# Patient Record
Sex: Male | Born: 1985
Health system: Southern US, Community
[De-identification: ages and names within clinical notes are randomized; demographics above are authoritative.]

## PROBLEM LIST (undated history)

## (undated) DIAGNOSIS — F419 Anxiety disorder, unspecified: Secondary | ICD-10-CM

## (undated) DIAGNOSIS — F988 Other specified behavioral and emotional disorders with onset usually occurring in childhood and adolescence: Secondary | ICD-10-CM

## (undated) DIAGNOSIS — C419 Malignant neoplasm of bone and articular cartilage, unspecified: Secondary | ICD-10-CM

## (undated) DIAGNOSIS — R0683 Snoring: Secondary | ICD-10-CM

## (undated) HISTORY — DX: Snoring: R06.83

## (undated) HISTORY — DX: Anxiety disorder, unspecified: F41.9

## (undated) HISTORY — PX: OTHER SURGICAL HISTORY: SHX169

## (undated) HISTORY — DX: Other specified behavioral and emotional disorders with onset usually occurring in childhood and adolescence: F98.8

## (undated) HISTORY — PX: WISDOM TOOTH EXTRACTION: SHX21

## (undated) HISTORY — DX: Malignant neoplasm of bone and articular cartilage, unspecified: C41.9

---
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
## 2006-06-18 ENCOUNTER — Encounter: Admission: RE | Admit: 2006-06-18 | Discharge: 2006-06-18 | Payer: Self-pay | Admitting: Orthopedic Surgery

## 2006-09-11 ENCOUNTER — Encounter: Admission: RE | Admit: 2006-09-11 | Discharge: 2006-09-11 | Payer: Self-pay | Admitting: Orthopedic Surgery

## 2012-03-01 DIAGNOSIS — Z85830 Personal history of malignant neoplasm of bone: Secondary | ICD-10-CM | POA: Insufficient documentation

## 2012-03-01 DIAGNOSIS — Z86018 Personal history of other benign neoplasm: Secondary | ICD-10-CM | POA: Insufficient documentation

## 2013-07-17 ENCOUNTER — Other Ambulatory Visit: Payer: Self-pay | Admitting: *Deleted

## 2013-07-17 ENCOUNTER — Ambulatory Visit
Admission: RE | Admit: 2013-07-17 | Discharge: 2013-07-17 | Disposition: A | Discharge status: Home / Self Care | Payer: No Typology Code available for payment source | Source: Ambulatory Visit | Attending: *Deleted | Admitting: *Deleted

## 2013-07-17 DIAGNOSIS — R7611 Nonspecific reaction to tuberculin skin test without active tuberculosis: Secondary | ICD-10-CM

## 2015-12-02 ENCOUNTER — Ambulatory Visit: Payer: Self-pay | Admitting: Interventional Cardiology

## 2015-12-03 ENCOUNTER — Other Ambulatory Visit (HOSPITAL_COMMUNITY): Payer: Self-pay

## 2015-12-15 ENCOUNTER — Other Ambulatory Visit (HOSPITAL_COMMUNITY): Payer: Self-pay | Admitting: Internal Medicine

## 2015-12-15 DIAGNOSIS — C419 Malignant neoplasm of bone and articular cartilage, unspecified: Secondary | ICD-10-CM

## 2016-01-05 ENCOUNTER — Ambulatory Visit (HOSPITAL_COMMUNITY): Payer: BLUE CROSS/BLUE SHIELD | Attending: Cardiology

## 2016-01-05 ENCOUNTER — Other Ambulatory Visit: Payer: Self-pay

## 2016-01-05 ENCOUNTER — Encounter (INDEPENDENT_AMBULATORY_CARE_PROVIDER_SITE_OTHER): Payer: Self-pay

## 2016-01-05 DIAGNOSIS — Z9221 Personal history of antineoplastic chemotherapy: Secondary | ICD-10-CM | POA: Diagnosis not present

## 2016-01-05 DIAGNOSIS — C419 Malignant neoplasm of bone and articular cartilage, unspecified: Secondary | ICD-10-CM | POA: Insufficient documentation

## 2016-01-05 DIAGNOSIS — I358 Other nonrheumatic aortic valve disorders: Secondary | ICD-10-CM | POA: Insufficient documentation

## 2016-01-25 DIAGNOSIS — M2170 Unequal limb length (acquired), unspecified site: Secondary | ICD-10-CM | POA: Diagnosis not present

## 2016-01-25 DIAGNOSIS — C4022 Malignant neoplasm of long bones of left lower limb: Secondary | ICD-10-CM | POA: Diagnosis not present

## 2016-01-25 DIAGNOSIS — Z85830 Personal history of malignant neoplasm of bone: Secondary | ICD-10-CM | POA: Diagnosis not present

## 2016-03-15 DIAGNOSIS — L237 Allergic contact dermatitis due to plants, except food: Secondary | ICD-10-CM | POA: Diagnosis not present

## 2016-03-15 DIAGNOSIS — Z6824 Body mass index (BMI) 24.0-24.9, adult: Secondary | ICD-10-CM | POA: Diagnosis not present

## 2016-04-06 DIAGNOSIS — M5489 Other dorsalgia: Secondary | ICD-10-CM | POA: Diagnosis not present

## 2016-04-06 DIAGNOSIS — Z6824 Body mass index (BMI) 24.0-24.9, adult: Secondary | ICD-10-CM | POA: Diagnosis not present

## 2016-04-06 DIAGNOSIS — R262 Difficulty in walking, not elsewhere classified: Secondary | ICD-10-CM | POA: Diagnosis not present

## 2016-04-20 DIAGNOSIS — R8299 Other abnormal findings in urine: Secondary | ICD-10-CM | POA: Diagnosis not present

## 2016-04-20 DIAGNOSIS — Z0000 Encounter for general adult medical examination without abnormal findings: Secondary | ICD-10-CM | POA: Diagnosis not present

## 2016-04-24 DIAGNOSIS — H9193 Unspecified hearing loss, bilateral: Secondary | ICD-10-CM | POA: Diagnosis not present

## 2016-04-24 DIAGNOSIS — R8299 Other abnormal findings in urine: Secondary | ICD-10-CM | POA: Diagnosis not present

## 2016-04-24 DIAGNOSIS — C419 Malignant neoplasm of bone and articular cartilage, unspecified: Secondary | ICD-10-CM | POA: Diagnosis not present

## 2016-04-24 DIAGNOSIS — Z1389 Encounter for screening for other disorder: Secondary | ICD-10-CM | POA: Diagnosis not present

## 2016-04-24 DIAGNOSIS — Z0000 Encounter for general adult medical examination without abnormal findings: Secondary | ICD-10-CM | POA: Diagnosis not present

## 2016-04-24 DIAGNOSIS — M5489 Other dorsalgia: Secondary | ICD-10-CM | POA: Diagnosis not present

## 2016-04-24 DIAGNOSIS — Z23 Encounter for immunization: Secondary | ICD-10-CM | POA: Diagnosis not present

## 2016-04-24 DIAGNOSIS — F418 Other specified anxiety disorders: Secondary | ICD-10-CM | POA: Diagnosis not present

## 2016-05-22 DIAGNOSIS — D229 Melanocytic nevi, unspecified: Secondary | ICD-10-CM | POA: Diagnosis not present

## 2016-05-22 DIAGNOSIS — I781 Nevus, non-neoplastic: Secondary | ICD-10-CM | POA: Diagnosis not present

## 2016-05-22 DIAGNOSIS — D239 Other benign neoplasm of skin, unspecified: Secondary | ICD-10-CM | POA: Diagnosis not present

## 2016-06-15 DIAGNOSIS — R351 Nocturia: Secondary | ICD-10-CM | POA: Diagnosis not present

## 2016-06-15 DIAGNOSIS — R3121 Asymptomatic microscopic hematuria: Secondary | ICD-10-CM | POA: Diagnosis not present

## 2016-06-21 DIAGNOSIS — R3121 Asymptomatic microscopic hematuria: Secondary | ICD-10-CM | POA: Diagnosis not present

## 2016-06-21 DIAGNOSIS — N200 Calculus of kidney: Secondary | ICD-10-CM | POA: Diagnosis not present

## 2016-11-30 DIAGNOSIS — Z6825 Body mass index (BMI) 25.0-25.9, adult: Secondary | ICD-10-CM | POA: Diagnosis not present

## 2016-11-30 DIAGNOSIS — R202 Paresthesia of skin: Secondary | ICD-10-CM | POA: Diagnosis not present

## 2016-11-30 DIAGNOSIS — L658 Other specified nonscarring hair loss: Secondary | ICD-10-CM | POA: Diagnosis not present

## 2017-01-09 DIAGNOSIS — L659 Nonscarring hair loss, unspecified: Secondary | ICD-10-CM | POA: Diagnosis not present

## 2017-02-23 DIAGNOSIS — S63502A Unspecified sprain of left wrist, initial encounter: Secondary | ICD-10-CM | POA: Diagnosis not present

## 2017-03-01 DIAGNOSIS — S63502D Unspecified sprain of left wrist, subsequent encounter: Secondary | ICD-10-CM | POA: Diagnosis not present

## 2017-03-26 DIAGNOSIS — S63502D Unspecified sprain of left wrist, subsequent encounter: Secondary | ICD-10-CM | POA: Diagnosis not present

## 2017-05-12 ENCOUNTER — Emergency Department (HOSPITAL_COMMUNITY): Payer: BLUE CROSS/BLUE SHIELD

## 2017-05-12 ENCOUNTER — Other Ambulatory Visit: Payer: Self-pay

## 2017-05-12 ENCOUNTER — Emergency Department (HOSPITAL_COMMUNITY)
Admission: EM | Admit: 2017-05-12 | Discharge: 2017-05-12 | Disposition: A | Discharge status: Home / Self Care | Payer: BLUE CROSS/BLUE SHIELD | Attending: Emergency Medicine | Admitting: Emergency Medicine

## 2017-05-12 DIAGNOSIS — N201 Calculus of ureter: Secondary | ICD-10-CM

## 2017-05-12 DIAGNOSIS — R112 Nausea with vomiting, unspecified: Secondary | ICD-10-CM | POA: Diagnosis not present

## 2017-05-12 DIAGNOSIS — R1011 Right upper quadrant pain: Secondary | ICD-10-CM | POA: Diagnosis not present

## 2017-05-12 DIAGNOSIS — R109 Unspecified abdominal pain: Secondary | ICD-10-CM | POA: Diagnosis not present

## 2017-05-12 DIAGNOSIS — N1330 Unspecified hydronephrosis: Secondary | ICD-10-CM | POA: Diagnosis not present

## 2017-05-12 DIAGNOSIS — R1031 Right lower quadrant pain: Secondary | ICD-10-CM | POA: Diagnosis not present

## 2017-05-12 LAB — CBC WITH DIFFERENTIAL/PLATELET
Basophils Absolute: 0 10*3/uL (ref 0.0–0.1)
Basophils Relative: 0 %
Eosinophils Absolute: 0 10*3/uL (ref 0.0–0.7)
Eosinophils Relative: 0 %
HEMATOCRIT: 41.2 % (ref 39.0–52.0)
Hemoglobin: 14.7 g/dL (ref 13.0–17.0)
LYMPHS PCT: 17 %
Lymphs Abs: 1.8 10*3/uL (ref 0.7–4.0)
MCH: 31.8 pg (ref 26.0–34.0)
MCHC: 35.7 g/dL (ref 30.0–36.0)
MCV: 89.2 fL (ref 78.0–100.0)
MONOS PCT: 8 %
Monocytes Absolute: 0.8 10*3/uL (ref 0.1–1.0)
NEUTROS ABS: 7.7 10*3/uL (ref 1.7–7.7)
Neutrophils Relative %: 75 %
Platelets: 325 10*3/uL (ref 150–400)
RBC: 4.62 MIL/uL (ref 4.22–5.81)
RDW: 12.8 % (ref 11.5–15.5)
WBC: 10.3 10*3/uL (ref 4.0–10.5)

## 2017-05-12 LAB — URINALYSIS, ROUTINE W REFLEX MICROSCOPIC
BACTERIA UA: NONE SEEN
Bilirubin Urine: NEGATIVE
Glucose, UA: NEGATIVE mg/dL
Ketones, ur: NEGATIVE mg/dL
Leukocytes, UA: NEGATIVE
Nitrite: NEGATIVE
PH: 8 (ref 5.0–8.0)
Protein, ur: NEGATIVE mg/dL
SPECIFIC GRAVITY, URINE: 1.003 — AB (ref 1.005–1.030)
Squamous Epithelial / LPF: NONE SEEN

## 2017-05-12 LAB — COMPREHENSIVE METABOLIC PANEL
ALBUMIN: 4.5 g/dL (ref 3.5–5.0)
ALT: 25 U/L (ref 17–63)
AST: 28 U/L (ref 15–41)
Alkaline Phosphatase: 61 U/L (ref 38–126)
Anion gap: 10 (ref 5–15)
BUN: 14 mg/dL (ref 6–20)
CHLORIDE: 103 mmol/L (ref 101–111)
CO2: 24 mmol/L (ref 22–32)
Calcium: 9.5 mg/dL (ref 8.9–10.3)
Creatinine, Ser: 0.98 mg/dL (ref 0.61–1.24)
GFR calc Af Amer: >60 mL/min (ref >60)
GFR calc non Af Amer: >60 mL/min (ref >60)
GLUCOSE: 102 mg/dL — AB (ref 65–99)
POTASSIUM: 3.5 mmol/L (ref 3.5–5.1)
Sodium: 137 mmol/L (ref 135–145)
Total Bilirubin: 1.7 mg/dL — ABNORMAL HIGH (ref 0.3–1.2)
Total Protein: 7.7 g/dL (ref 6.5–8.1)

## 2017-05-12 LAB — LIPASE, BLOOD: Lipase: 27 U/L (ref 11–51)

## 2017-05-12 MED ORDER — KETOROLAC TROMETHAMINE 30 MG/ML IJ SOLN
30.0000 mg | Freq: Once | INTRAMUSCULAR | Status: AC
  Administered 2017-05-12: 30 mg via INTRAVENOUS
  Filled 2017-05-12: qty 1

## 2017-05-12 MED ORDER — OXYCODONE-ACETAMINOPHEN 5-325 MG PO TABS
1.0000 | ORAL_TABLET | Freq: Once | ORAL | Status: AC
  Administered 2017-05-12: 1 via ORAL
  Filled 2017-05-12: qty 1

## 2017-05-12 MED ORDER — ONDANSETRON 4 MG PO TBDP: 4.0000 mg | ORAL_TABLET | Freq: Three times a day (TID) | ORAL | 0 refills | Status: DC | PRN

## 2017-05-12 MED ORDER — OXYCODONE-ACETAMINOPHEN 5-325 MG PO TABS: 2.0000 | ORAL_TABLET | Freq: Four times a day (QID) | ORAL | 0 refills | Status: DC | PRN

## 2017-05-12 MED ORDER — MORPHINE SULFATE (PF) 4 MG/ML IV SOLN
4.0000 mg | Freq: Once | INTRAVENOUS | Status: AC
Start: 2017-05-12 — End: 2017-05-12
  Administered 2017-05-12 (×2): 4 mg via INTRAVENOUS
  Filled 2017-05-12: qty 1

## 2017-05-12 MED ORDER — ONDANSETRON HCL 4 MG/2ML IJ SOLN
4.0000 mg | Freq: Once | INTRAMUSCULAR | Status: AC
  Administered 2017-05-12: 4 mg via INTRAVENOUS
  Filled 2017-05-12: qty 2

## 2017-05-12 MED ORDER — TAMSULOSIN HCL 0.4 MG PO CAPS: 0.4000 mg | ORAL_CAPSULE | Freq: Every day | ORAL | 0 refills | Status: DC

## 2017-05-12 NOTE — ED Triage Notes (Signed)
Pt reports right flank pain and nausea.

## 2017-05-12 NOTE — ED Provider Notes (Signed)
[facility] DEPT Provider Note   CSN: [id number] Arrival date & time: 05/12/17  1421     History   Chief Complaint Chief Complaint  Patient presents with  . Flank Pain    HPI Adam Trevino is a 31 y.o. male.  31 year old healthy male who presents with right flank pain and vomiting.  Several days ago, the patient began having intermittent, mild to moderate right flank pain and increased urinary frequency.  He did not notice any dysuria or hematuria.  2 days ago, he felt unwell and had an episode of vomiting but yesterday he felt better and did not think much of it.  Last night he started feeling unwell again and vomited again.  His right flank pain started again today and he has had associated nausea.  He denies any fevers or diarrhea.  He has had some mild upper right abdominal pain in the same corresponding area as his right flank.  He denies any lower pain or radiation into his groin.  No testicular pain.  No difficulty urinating.  No history of kidney stones but he does state that several months ago he was worked up for microscopic hematuria with no abnormal findings.  No medications prior to arrival.  No cough/cold symptoms or recent illness.   The history is provided by the patient.  Flank Pain     No past medical history on file.  There are no active problems to display for this patient.   No past surgical history on file.     Home Medications    Prior to Admission medications   Medication Sig Start Date End Date Taking? Authorizing Provider  ondansetron (ZOFRAN ODT) 4 MG disintegrating tablet Take 1 tablet (4 mg total) every 8 (eight) hours as needed by mouth for nausea or vomiting. 05/12/17   Little, Wenda Overland, MD  oxyCODONE-acetaminophen (PERCOCET) 5-325 MG tablet Take 2 tablets every 6 (six) hours as needed by mouth. 05/12/17   Little, Wenda Overland, MD  tamsulosin [facility]) 0.4 MG CAPS capsule Take 1 capsule (0.4 mg total)  daily by mouth. 05/12/17   Little, Wenda Overland, MD    Family History No family history on file.  Social History Social History   Tobacco Use  . Smoking status: Not on file  Substance Use Topics  . Alcohol use: Not on file  . Drug use: Not on file     Allergies   Patient has no known allergies.   Review of Systems Review of Systems  Genitourinary: Positive for flank pain.   All other systems reviewed and are negative except that which was mentioned in HPI   Physical Exam Updated Vital Signs BP 133/88 (BP Location: Right Arm)   Pulse 82   Temp 97.9 F (36.6 C) (Oral)   Resp 18   SpO2 96%   Physical Exam  Constitutional: He is oriented to person, place, and time. He appears well-developed and well-nourished. No distress.  HENT:  Head: Normocephalic and atraumatic.  Moist mucous membranes  Eyes: Conjunctivae are normal.  Neck: Neck supple.  Cardiovascular: Normal rate, regular rhythm and normal heart sounds.  No murmur heard. Pulmonary/Chest: Effort normal and breath sounds normal.  Abdominal: Soft. Bowel sounds are normal. He exhibits no distension. There is no tenderness.  Musculoskeletal: He exhibits no edema.  No CVA tenderness  Neurological: He is alert and oriented to person, place, and time.  Fluent speech  Skin: Skin is warm and dry.  Psychiatric: He has  a normal mood and affect. Judgment normal.  Nursing note and vitals reviewed.    ED Treatments / Results  Labs (all labs ordered are listed, but only abnormal results are displayed) Labs Reviewed  URINALYSIS, ROUTINE W REFLEX MICROSCOPIC - Abnormal; Notable for the following components:      Result Value   Color, Urine STRAW (*)    Specific Gravity, Urine 1.003 (*)    Hgb urine dipstick MODERATE (*)    All other components within normal limits  COMPREHENSIVE METABOLIC PANEL - Abnormal; Notable for the following components:   Glucose, Bld 102 (*)    Total Bilirubin 1.7 (*)    All other  components within normal limits  LIPASE, BLOOD  CBC WITH DIFFERENTIAL/PLATELET    EKG  EKG Interpretation None       Radiology Ct Renal Stone Study  Result Date: 05/12/2017 CLINICAL DATA:  Right flank pain. EXAM: CT ABDOMEN AND PELVIS WITHOUT CONTRAST TECHNIQUE: Multidetector CT imaging of the abdomen and pelvis was performed following the standard protocol without IV contrast. COMPARISON:  None. FINDINGS: Lower chest: No acute abnormality. Hepatobiliary: No focal liver abnormality is seen. No gallstones, gallbladder wall thickening, or biliary dilatation. Pancreas: Unremarkable. No pancreatic ductal dilatation or surrounding inflammatory changes. Spleen: Normal in size without focal abnormality. Adrenals/Urinary Tract: The adrenal glands and left kidney are unremarkable. There is a 4 mm calculus in the distal right ureter, just proximal to the UVJ. There is resultant mild right hydronephrosis. The bladder is decompressed. Stomach/Bowel: Stomach is within normal limits. Appendix appears normal. No evidence of bowel wall thickening, distention, or inflammatory changes. Vascular/Lymphatic: No significant vascular findings are present. No enlarged abdominal or pelvic lymph nodes. Reproductive: Prostate is unremarkable. Other: No free fluid or pneumoperitoneum. Musculoskeletal: No acute or significant osseous findings. IMPRESSION: 1. 4 mm calculus in the distal right ureter, just proximal to the UVJ, with resultant mild right hydronephrosis. Electronically Signed   By: Titus Dubin M.D.   On: 05/12/2017 16:54    Procedures Procedures (including critical care time)  Medications Ordered in ED Medications  ondansetron (ZOFRAN) injection 4 mg (4 mg Intravenous Given 05/12/17 1642)  morphine 4 MG/ML injection 4 mg (4 mg Intravenous Given 05/12/17 1649)  oxyCODONE-acetaminophen (PERCOCET/ROXICET) 5-325 MG per tablet 1 tablet (1 tablet Oral Given 05/12/17 1749)  ketorolac (TORADOL) 30 MG/ML  injection 30 mg (30 mg Intravenous Given 05/12/17 1749)     Initial Impression / Assessment and Plan / ED Course  I have reviewed the triage vital signs and the nursing notes.  Pertinent labs & imaging results that were available during my care of the patient were reviewed by me and considered in my medical decision making (see chart for details).    Several days intermittent R flank pain, urinary frequency, and N/V.  Nontoxic on exam with normal vital signs, afebrile.  No focal lower abdominal tenderness.  I suspect kidney stone, because the patient does not have a history of this and it has been going on for several days, obtain CT of abdomen to rule out obstructing stone.  Cr. And WBC normal, UA w/ trace blood but no infection. CT shows 61mm calculus distal R ureter near UVJ w/ mild hydro.  Given patient is well-appearing, comfortable after above medications, and without any signs of infection I feel he is appropriate for outpatient management.  I have discussed supportive measures, the importance of good pain control and control of nausea, and follow-up with urologist.  Discussed using strainer.  Extensively reviewed return precautions.  He voiced understanding was discharged in satisfactory condition. Final Clinical Impressions(s) / ED Diagnoses   Final diagnoses:  Right ureteral stone    ED Discharge Orders        Ordered    tamsulosin (FLOMAX) 0.4 MG CAPS capsule  Daily     05/12/17 1750    oxyCODONE-acetaminophen (PERCOCET) 5-325 MG tablet  Every 6 hours PRN     05/12/17 1750    ondansetron (ZOFRAN ODT) 4 MG disintegrating tablet  Every 8 hours PRN     05/12/17 1750       Little, Wenda Overland, MD 05/12/17 1759

## 2017-05-16 DIAGNOSIS — R8271 Bacteriuria: Secondary | ICD-10-CM | POA: Diagnosis not present

## 2017-05-16 DIAGNOSIS — N201 Calculus of ureter: Secondary | ICD-10-CM | POA: Diagnosis not present

## 2017-05-23 DIAGNOSIS — N201 Calculus of ureter: Secondary | ICD-10-CM | POA: Diagnosis not present

## 2017-06-13 DIAGNOSIS — N201 Calculus of ureter: Secondary | ICD-10-CM | POA: Diagnosis not present

## 2017-07-24 DIAGNOSIS — F321 Major depressive disorder, single episode, moderate: Secondary | ICD-10-CM | POA: Diagnosis not present

## 2017-11-06 DIAGNOSIS — L821 Other seborrheic keratosis: Secondary | ICD-10-CM | POA: Diagnosis not present

## 2017-11-06 DIAGNOSIS — D1801 Hemangioma of skin and subcutaneous tissue: Secondary | ICD-10-CM | POA: Diagnosis not present

## 2017-11-06 DIAGNOSIS — L814 Other melanin hyperpigmentation: Secondary | ICD-10-CM | POA: Diagnosis not present

## 2017-11-06 DIAGNOSIS — D2270 Melanocytic nevi of unspecified lower limb, including hip: Secondary | ICD-10-CM | POA: Diagnosis not present

## 2017-12-04 DIAGNOSIS — M7072 Other bursitis of hip, left hip: Secondary | ICD-10-CM | POA: Diagnosis not present

## 2018-01-29 DIAGNOSIS — R82998 Other abnormal findings in urine: Secondary | ICD-10-CM | POA: Diagnosis not present

## 2018-01-29 DIAGNOSIS — Z0000 Encounter for general adult medical examination without abnormal findings: Secondary | ICD-10-CM | POA: Diagnosis not present

## 2018-02-05 DIAGNOSIS — R350 Frequency of micturition: Secondary | ICD-10-CM | POA: Diagnosis not present

## 2018-02-05 DIAGNOSIS — R202 Paresthesia of skin: Secondary | ICD-10-CM | POA: Diagnosis not present

## 2018-02-05 DIAGNOSIS — Z0000 Encounter for general adult medical examination without abnormal findings: Secondary | ICD-10-CM | POA: Diagnosis not present

## 2018-02-05 DIAGNOSIS — H9193 Unspecified hearing loss, bilateral: Secondary | ICD-10-CM | POA: Diagnosis not present

## 2018-02-05 DIAGNOSIS — L658 Other specified nonscarring hair loss: Secondary | ICD-10-CM | POA: Diagnosis not present

## 2018-02-05 DIAGNOSIS — Z1331 Encounter for screening for depression: Secondary | ICD-10-CM | POA: Diagnosis not present

## 2018-02-05 DIAGNOSIS — Z23 Encounter for immunization: Secondary | ICD-10-CM | POA: Diagnosis not present

## 2018-04-02 ENCOUNTER — Encounter: Payer: Self-pay | Admitting: Neurology

## 2018-04-04 ENCOUNTER — Ambulatory Visit (INDEPENDENT_AMBULATORY_CARE_PROVIDER_SITE_OTHER): Payer: BLUE CROSS/BLUE SHIELD | Admitting: Neurology

## 2018-04-04 ENCOUNTER — Encounter: Payer: Self-pay | Admitting: Neurology

## 2018-04-04 VITALS — BP 140/87 | HR 72 | Ht 67.0 in | Wt 163.0 lb

## 2018-04-04 DIAGNOSIS — F5105 Insomnia due to other mental disorder: Secondary | ICD-10-CM | POA: Diagnosis not present

## 2018-04-04 DIAGNOSIS — F99 Mental disorder, not otherwise specified: Secondary | ICD-10-CM

## 2018-04-04 DIAGNOSIS — G4719 Other hypersomnia: Secondary | ICD-10-CM | POA: Diagnosis not present

## 2018-04-04 DIAGNOSIS — R0683 Snoring: Secondary | ICD-10-CM

## 2018-04-04 NOTE — Progress Notes (Signed)
SLEEP MEDICINE CLINIC   Provider:  Larey Seat, M.D.   Primary Care Physician:  [name], MD   Referring Provider: [name], MD    Chief Complaint  Patient presents with  . New Patient (Initial Visit)    pt alone, rm 11. pt states has had diffivulty with sleeping. he has a hard time stopping the brain from being on the go. smokes marijuana at bedtime and this allows him to fall asleep. -states that without it he can't sleep. Even after he gets a full 8 hours of sleep he still feels tired in AM . he has been told he snores. he wakes up  gasping for air and from his snoring.     HPI:  Adam Trevino is a 32 y.o. male patient  seen here on 04-04-2018  in a referral  from Dr. Joylene Draft for a sleep evaluation.   Chief complaint according to patient : Adam Trevino is a 32 year old Caucasian right-handed gentleman who reports that he has trouble going to sleep and finding restful, restorative sleep.  It seems to be not important how many hours he sleeps as he feels unrefreshed nonetheless.  He also reports that there are some days but he has a lot of energy alternating with those times where he has none.  He would like to see if this is related to a primary sleep disorder.  He has been told that he snores, by a sequence of girlfriends over years.   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 He travels within the [place] region. No jet lag, no tie zone changes. He has no set schedule. He skips meals, no meal times.  When he travels he will check into his hotel room and then goes for the next fastest available food.  At home he is usually home before 8 PM, he may not have had lunch he may have skipped breakfast on some days - dinnertime varies, usually fast food. There are 2 days each week where he does not travel and is actually at home. He smokes marijuana each night at 9-10 PM.  He aims to relax  on the couch, sleeps there in front of the TV, usually between 10 and 11 PM and between midnight and 1 he will go to his bedroom from there and continues to sleep.  Bedroom is cool, quiet and dark.  There is no TV in the bedroom.  He usually sleeps through until 6 AM. One bathroom break.  If not smoking pot :"my mind races so much " .  Sleep medical history and family sleep history:  Maternal uncle is insomnic. Father died when patient was 36, he had cancer at age 50-13 , osteosarcoma.  Past diagnosis anxiety, snoring, has seen audiology the patient was diagnosed with cancer at age 4, lost hearing after chemotherapy. He was in psychiatric treatment for anxiety- Xanax ( [facility]) had to go to detox.  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 In summer he golfs- keeps active. The patient worked for a year for an [employer] firm [employer] here in [place].   Review of Systems: Out of a complete 14 system review, the patient complains of only the following symptoms, and all other reviewed systems are negative.  anxiety.   Epworth Sleepiness Score : 7/ 24 points ,  Fatigue Severity Score:  44/ 63 points ,  depression score high.    Family History  Problem Relation Age of Onset  . Colon cancer Mother   . Lung cancer Father   . Prostate cancer Maternal Grandfather   . Heart attack Paternal Grandfather     Past Medical History:  Diagnosis Date  . ADD (attention deficit disorder)   . Anxiety   . 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  . Snoring     Allergies as of 04/04/2018  . (No Known Allergies)    Vitals:  BP 140/87   Pulse 72   Ht 5\' 7"  (1.702 m)   Wt 163 lb (73.9 kg)   BMI 25.53 kg/m  Last Weight:  Wt Readings from Last 1 Encounters:  04/04/18 163 lb (73.9 kg)   AST:MHDQ mass index is 25.53 kg/m.     Last Height:   Ht Readings from Last 1 Encounters:  04/04/18 5\' 7"   (1.702 m)    Physical exam:  General: The patient is awake, alert and appears not in acute distress. The patient is well groomed. Head: Normocephalic, atraumatic. Neck is supple. Mallampati 2  neck circumference:16. 25 " . Nasal airflow patent ,facial hair. Retrognathia is not seen., but crowded lower jaw.  Cardiovascular:  Regular rate and rhythm, without  murmurs or carotid bruit, and without distended neck veins. Respiratory: Lungs are clear to auscultation. Skin:  Without evidence of edema, or rash Trunk: BMI is 25.5  The patient's posture is erect    Neurologic exam : The patient is awake and alert, oriented to place and time.   Memory subjective  described as intact.    Attention span & concentration ability appears limited Speech is fluent,  without  dysarthria, dysphonia or aphasia.  Mood and affect are defensive, impulsive, pressured speech  Cranial nerves: Pupils are equal and briskly reactive to light. Funduscopic exam without evidence of pallor or edema. Extraocular movements  in vertical and horizontal planes intact and without nystagmus. Visual fields by finger perimetry are intact. Hearing to finger rub intact.   Facial sensation intact to fine touch.  Facial motor strength is symmetric and tongue and uvula move midline. Shoulder shrug was symmetrical.   Motor exam:   Normal tone, muscle bulk and symmetric strength in all extremities. Sensory:  Fine touch, pinprick and vibration were tested in all extremities. Proprioception tested in the upper extremities was normal. Coordination:Finger-to-nose maneuver  normal without evidence of ataxia, dysmetria or tremor. Gait and station: Patient walks without assistive device and is able unassisted to climb up to the exam table. Strength within normal limits.  Stance is stable and normal.  Toe stand unfragmented. Turns with  3  Steps.  Deep tendon reflexes: in the  upper and lower extremities are symmetric and intact.     Assessment:  After physical and neurologic examination, review of laboratory studies,  Personal review of imaging studies, reports of other /same  Imaging studies, results of polysomnography and / or neurophysiology testing and pre-existing records as far as provided in visit., my assessment is :  I think that ADD, anxiety and marijuana all play a role in the cyclic manifestation of insomnia. He mentioned a possible diagnosis of PTSD. Sleep apnea. Is still a possibility - snoring, gasping and poor sleep quality.   1) order for HST - watch pat. I asked patient to keep a journal about his sleep , and I like for him to  give me a report about the test night as well.    The patient was advised of the nature of the diagnosed disorder , the treatment options and the  risks for general health and wellness arising from not treating the condition.  I spent more than 45 minutes of face to face time with the patient.Greater than 50% of time was spent in counseling and coordination of care. We have discussed the diagnosis and differential and I answered the patient's questions.   He is not willing to quit marijuana but as he will get better sleep in the future , he may be able to cut down.    Larey Seat, MD 44/96/7591, 63:84 AM  Certified in Neurology by ABPN Certified in [place] by [facility] [address], [place], [place] [address]

## 2018-06-03 ENCOUNTER — Ambulatory Visit (INDEPENDENT_AMBULATORY_CARE_PROVIDER_SITE_OTHER): Payer: BLUE CROSS/BLUE SHIELD | Admitting: Neurology

## 2018-06-03 DIAGNOSIS — G4710 Hypersomnia, unspecified: Secondary | ICD-10-CM

## 2018-06-03 DIAGNOSIS — F99 Mental disorder, not otherwise specified: Secondary | ICD-10-CM

## 2018-06-03 DIAGNOSIS — G4719 Other hypersomnia: Secondary | ICD-10-CM

## 2018-06-03 DIAGNOSIS — F5105 Insomnia due to other mental disorder: Secondary | ICD-10-CM

## 2018-06-03 DIAGNOSIS — G4730 Sleep apnea, unspecified: Secondary | ICD-10-CM

## 2018-06-03 DIAGNOSIS — R0683 Snoring: Secondary | ICD-10-CM

## 2018-06-08 NOTE — Addendum Note (Signed)
Addended by: Larey Seat on: 06/08/2018 11:48 AM   Modules accepted: Orders

## 2018-06-08 NOTE — Procedures (Signed)
NAME: Adam Trevino                                                              DOB: 20-Aug-1985 MEDICAL RECORD No: [id number]                                               DOS:  06/03/2018 REFERRING PHYSICIAN: [name] ,MD STUDY PERFORMED: Home Sleep Test on Watch Pat HISTORY: DESTRY DAUBER is a 32 y.o. male patient, seen on 04-04-2018 in a referral from Dr. Joylene Draft for a sleep evaluation.  Chief complaint according to patient Mr. Larsen Dungan is a 32 year old Caucasian right-handed gentleman who reports that he has trouble going to sleep and finding restful, restorative sleep.  It seems to be not important how many hours he sleeps as he feels unrefreshed nonetheless.  He also reports that there are some days but he has a lot of energy alternating with those times where he has none. He would like to see if this is related to a primary sleep disorder.  He has been told that he snores, by a sequence of girlfriends over years.   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 He travels within the [place]. No jet lag, no time zone changes. He has no set schedule. He skips meals, no meal times. At home he is usually eating before 8 PM, he may not have had lunch and he may have skipped breakfast on some days - dinnertime varies, usually fast food. There are 2 days each week where he does not travel and is actually at home. He smokes marijuana each night at 9-10 PM .If not smoking pot he stated that: "my mind races so much". Epworth Sleepiness score endorsed at 7/24 points. BMI is 25.6kg/m2.   STUDY RESULTS:  Total Recording Time: 8 h 25 min, Valid Sleep Time: 6 h 29 min.  REM sleep proportion in Total Sleep Time: 9.5%. Total Apnea/Hypopnea Index (AHI): 6.3 /h, RDI: 13.8/h, REM AHI 6.5/h. Average Oxygen Saturation: 95 %; Lowest Oxygen Desaturation: 90 %.  Total Time in Oxygen Saturation below 89 %: 0.0 minutes.   Average Heart Rate: 57 bpm (between 41 and 96 bpm). IMPRESSION: Mils Obstructive apnea with minimal REM sleep accentuation, and with loud snoring.  RECOMMENDATION: the RDI is no longer mild, but moderate severe. He can address this with a dental device or CPAP.  Given his lifestyle, and the associated travelling frequency, I think a dental device is easier to use. I will make a referral to Dr. Oneal Grout.  I certify that I have reviewed the raw data recording prior to the issuance of this report in accordance with the standards of the American Academy of Sleep Medicine (AASM). Larey Seat, M.D.   06-08-2018    Medical Director of [facility], accredited by the AASM. Diplomat of the ABPN and ABSM.

## 2018-06-11 ENCOUNTER — Telehealth: Payer: Self-pay | Admitting: Neurology

## 2018-06-11 NOTE — Telephone Encounter (Signed)
I called pt. I advised pt that Dr. Dr Dohmeier reviewed their sleep study results and found that pt has minimal/mild sleep apnea. Dr. Brett Fairy recommends that pt start a dental device and if he didn't want to do that CPAP would be next option. I reviewed the dental device and what that would do as far as treatment for him and the process. Pt declines starting any treatment at this time. Pt verbalized understanding of results. Pt had no questions at this time but was encouraged to call back if questions arise. Pt will call back if he decides to move forward with treatment.

## 2018-06-11 NOTE — Telephone Encounter (Signed)
-----   Message from Larey Seat, MD sent at 06/08/2018 11:48 AM EST ----- Adam Trevino is a loud snoring but has mild apnea with only a slight accentuation in REM sleep.He would be a good dental device candidate. If not interested in dental device, would recommend CPAP, weight loss and avoiding to sleep supine.

## 2018-08-27 DIAGNOSIS — J019 Acute sinusitis, unspecified: Secondary | ICD-10-CM | POA: Diagnosis not present

## 2018-08-27 DIAGNOSIS — R350 Frequency of micturition: Secondary | ICD-10-CM | POA: Diagnosis not present

## 2018-08-27 DIAGNOSIS — R05 Cough: Secondary | ICD-10-CM | POA: Diagnosis not present

## 2019-01-26 DIAGNOSIS — L255 Unspecified contact dermatitis due to plants, except food: Secondary | ICD-10-CM | POA: Diagnosis not present

## 2019-04-25 DIAGNOSIS — R350 Frequency of micturition: Secondary | ICD-10-CM | POA: Diagnosis not present

## 2019-04-25 DIAGNOSIS — Z0000 Encounter for general adult medical examination without abnormal findings: Secondary | ICD-10-CM | POA: Diagnosis not present

## 2019-04-29 DIAGNOSIS — J019 Acute sinusitis, unspecified: Secondary | ICD-10-CM | POA: Diagnosis not present

## 2019-04-29 DIAGNOSIS — Z0000 Encounter for general adult medical examination without abnormal findings: Secondary | ICD-10-CM | POA: Diagnosis not present

## 2019-04-29 DIAGNOSIS — Z1331 Encounter for screening for depression: Secondary | ICD-10-CM | POA: Diagnosis not present

## 2019-04-29 DIAGNOSIS — N200 Calculus of kidney: Secondary | ICD-10-CM | POA: Diagnosis not present

## 2019-04-29 DIAGNOSIS — H9190 Unspecified hearing loss, unspecified ear: Secondary | ICD-10-CM | POA: Diagnosis not present

## 2019-04-29 DIAGNOSIS — Z800 Family history of malignant neoplasm of digestive organs: Secondary | ICD-10-CM | POA: Diagnosis not present

## 2019-08-21 DIAGNOSIS — F4310 Post-traumatic stress disorder, unspecified: Secondary | ICD-10-CM | POA: Diagnosis not present

## 2019-08-21 DIAGNOSIS — F411 Generalized anxiety disorder: Secondary | ICD-10-CM | POA: Diagnosis not present

## 2019-08-21 DIAGNOSIS — G43009 Migraine without aura, not intractable, without status migrainosus: Secondary | ICD-10-CM | POA: Diagnosis not present

## 2019-10-20 IMAGING — CT CT RENAL STONE PROTOCOL
2 of 3 series · 16 of 46 positions shown, 18 images · non-contrast
Comparison: None.

CLINICAL DATA: Right flank pain.

EXAM:
CT ABDOMEN AND PELVIS WITHOUT CONTRAST
TECHNIQUE: Multidetector CT imaging of the abdomen and pelvis was performed
following the standard protocol without IV contrast.

[Series 3: lung · axial · 0.74mm/px · z∈[-124,-48]mm · 13 of 44 slices shown, 15 images]
[im 3/44  soft-tissue]
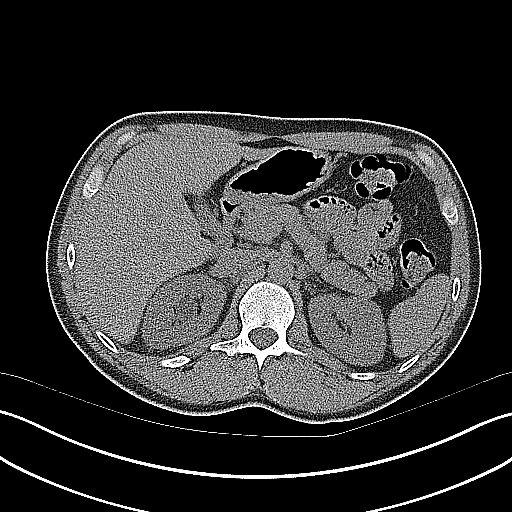
[im 3/44  bone]
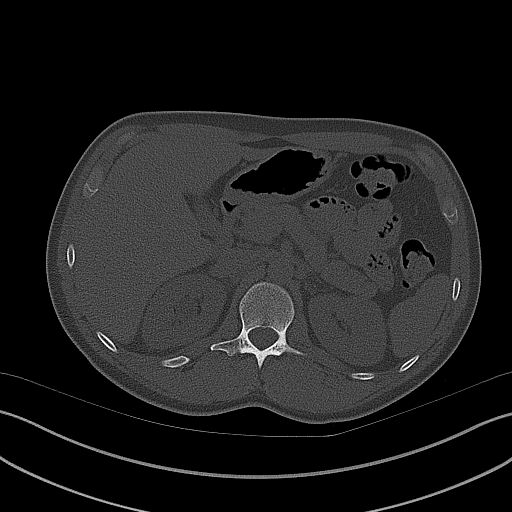
[im 6/44  soft-tissue]
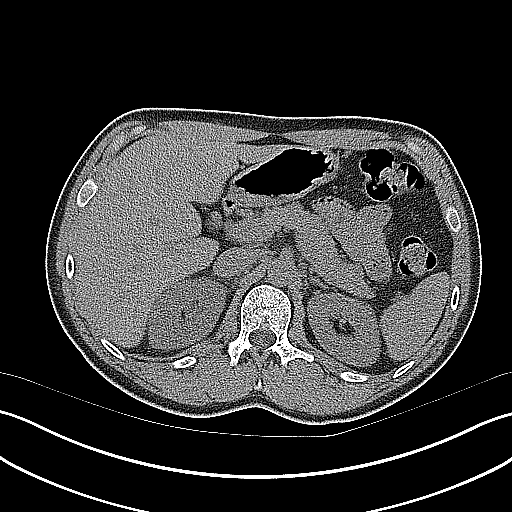
[im 9/44  soft-tissue]
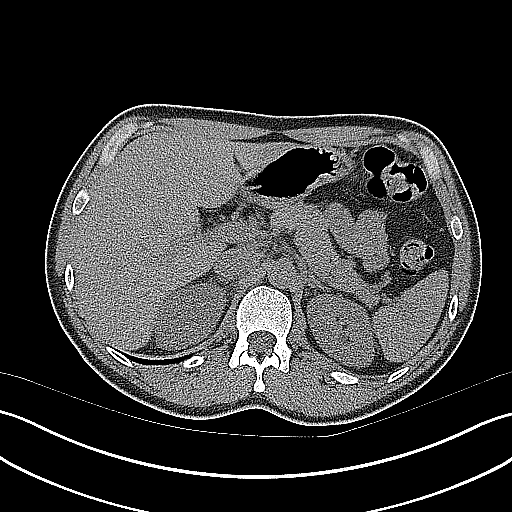
[im 13/44  soft-tissue]
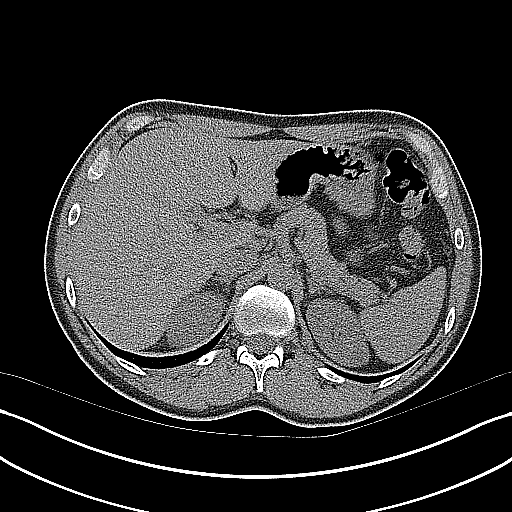
[im 16/44  soft-tissue]
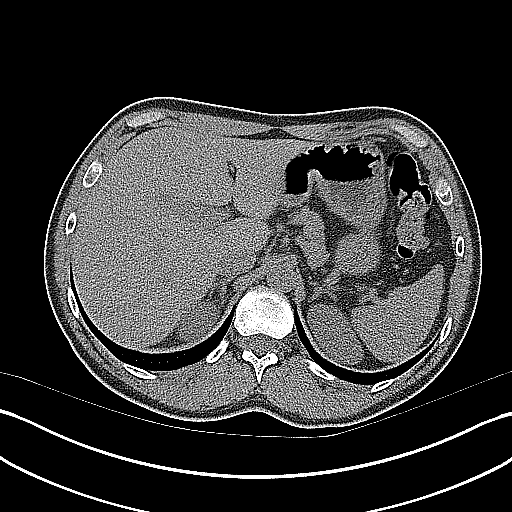
[im 19/44  soft-tissue]
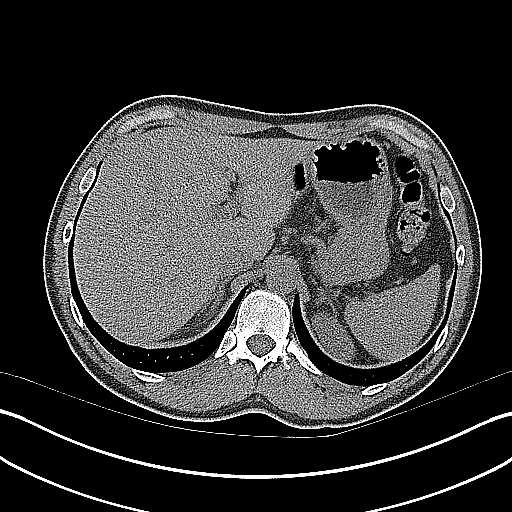
[im 23/44  soft-tissue]
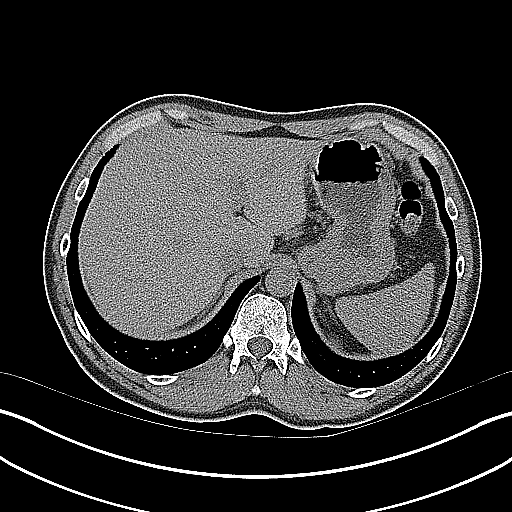
[im 25/44  soft-tissue]
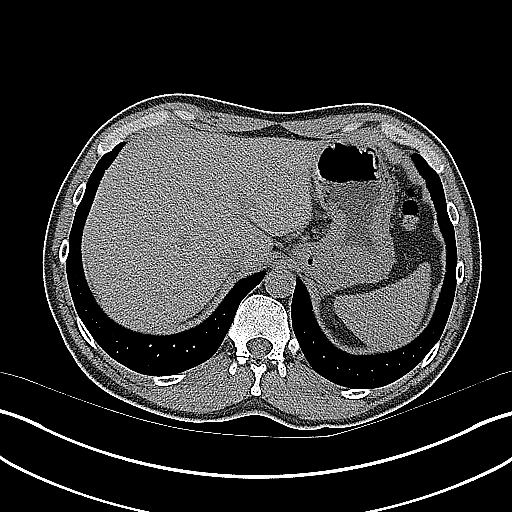
[im 28/44  soft-tissue]
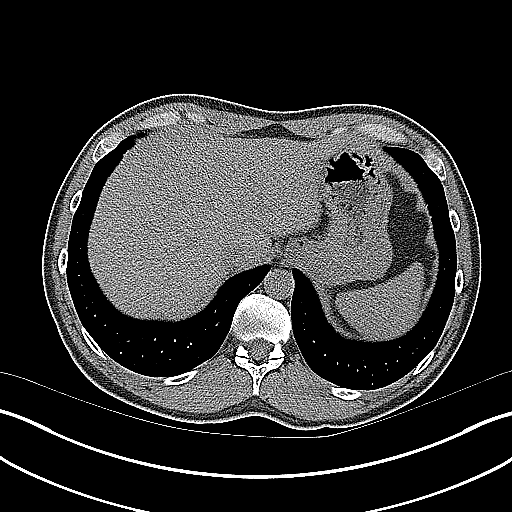
[im 28/44  bone]
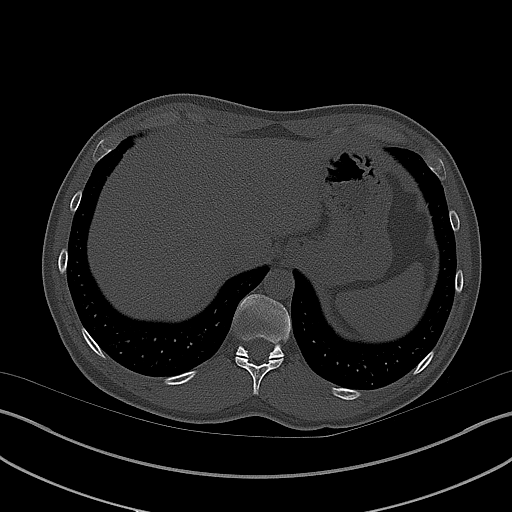
[im 31/44  soft-tissue]
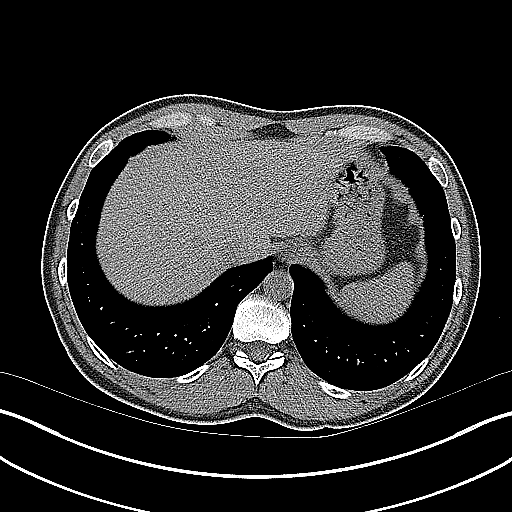
[im 35/44  soft-tissue]
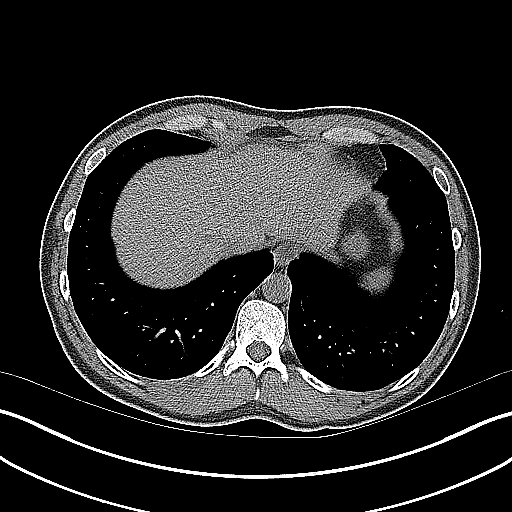
[im 38/44  soft-tissue]
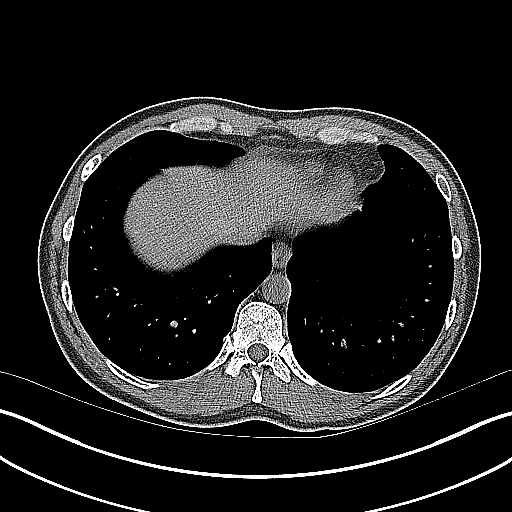
[im 41/44  soft-tissue]
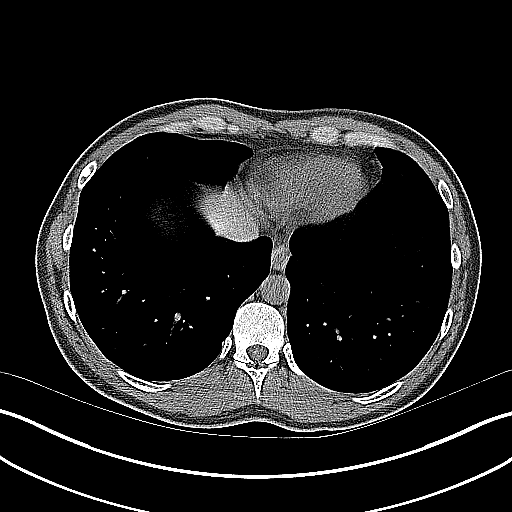

[Series 4: coronal · coronal · 0.69mm/px · 3 of 117 slices shown]
[im 39/117  soft-tissue]
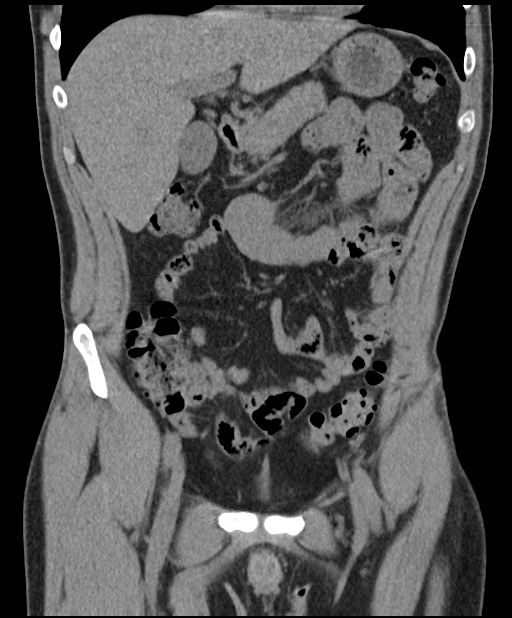
[im 52/117  soft-tissue]
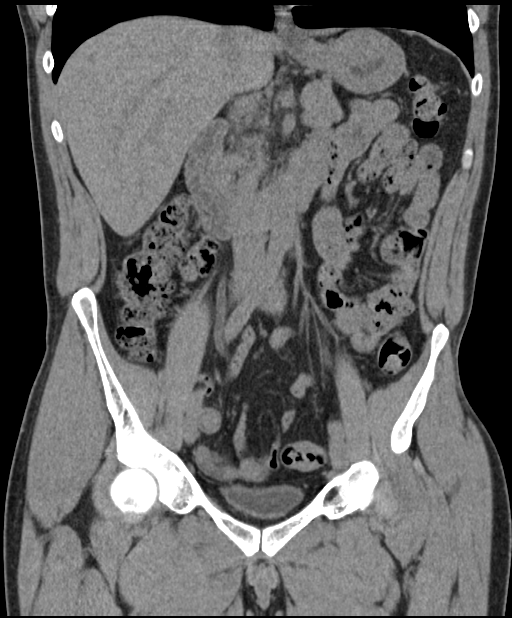
[im 65/117  soft-tissue]
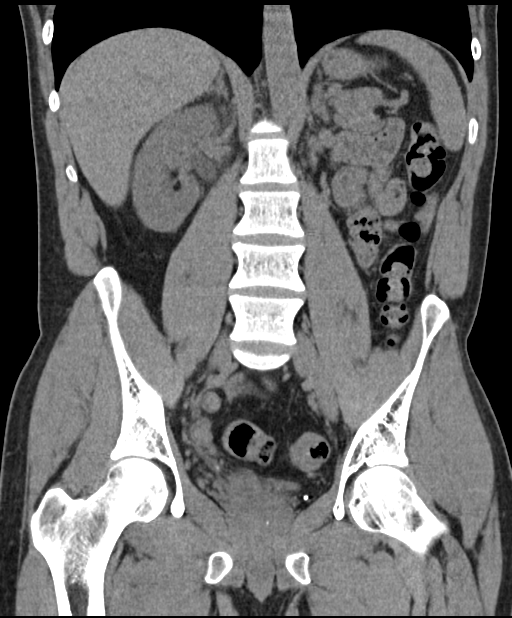

[16 of 46 positions shown; findings below may reference images not displayed]

FINDINGS: Lower chest: No acute abnormality.

Hepatobiliary: No focal liver abnormality is seen. No gallstones,
gallbladder wall thickening, or biliary dilatation.

Pancreas: Unremarkable. No pancreatic ductal dilatation or
surrounding inflammatory changes.

Spleen: Normal in size without focal abnormality.

Adrenals/Urinary Tract: The adrenal glands and left kidney are
unremarkable. There is a 4 mm calculus in the distal right ureter,
just proximal to the UVJ. There is resultant mild right
hydronephrosis. The bladder is decompressed.

Stomach/Bowel: Stomach is within normal limits. Appendix appears
normal. No evidence of bowel wall thickening, distention, or
inflammatory changes.

Vascular/Lymphatic: No significant vascular findings are present. No
enlarged abdominal or pelvic lymph nodes.

Reproductive: Prostate is unremarkable.

Other: No free fluid or pneumoperitoneum.

Musculoskeletal: No acute or significant osseous findings.
IMPRESSION: 1. 4 mm calculus in the distal right ureter, just proximal to the
UVJ, with resultant mild right hydronephrosis.

## 2019-12-23 DIAGNOSIS — G43009 Migraine without aura, not intractable, without status migrainosus: Secondary | ICD-10-CM | POA: Diagnosis not present

## 2020-01-29 DIAGNOSIS — D229 Melanocytic nevi, unspecified: Secondary | ICD-10-CM | POA: Diagnosis not present

## 2020-01-29 DIAGNOSIS — D1801 Hemangioma of skin and subcutaneous tissue: Secondary | ICD-10-CM | POA: Diagnosis not present

## 2020-01-29 DIAGNOSIS — Z1283 Encounter for screening for malignant neoplasm of skin: Secondary | ICD-10-CM | POA: Diagnosis not present

## 2020-01-29 DIAGNOSIS — D179 Benign lipomatous neoplasm, unspecified: Secondary | ICD-10-CM | POA: Diagnosis not present

## 2020-03-15 DIAGNOSIS — L237 Allergic contact dermatitis due to plants, except food: Secondary | ICD-10-CM | POA: Diagnosis not present

## 2020-05-12 DIAGNOSIS — R202 Paresthesia of skin: Secondary | ICD-10-CM | POA: Diagnosis not present

## 2020-05-12 DIAGNOSIS — Z0000 Encounter for general adult medical examination without abnormal findings: Secondary | ICD-10-CM | POA: Diagnosis not present

## 2020-05-12 DIAGNOSIS — R3121 Asymptomatic microscopic hematuria: Secondary | ICD-10-CM | POA: Diagnosis not present

## 2020-05-12 DIAGNOSIS — L659 Nonscarring hair loss, unspecified: Secondary | ICD-10-CM | POA: Diagnosis not present

## 2020-05-12 DIAGNOSIS — R7989 Other specified abnormal findings of blood chemistry: Secondary | ICD-10-CM | POA: Diagnosis not present

## 2020-05-12 DIAGNOSIS — Z800 Family history of malignant neoplasm of digestive organs: Secondary | ICD-10-CM | POA: Diagnosis not present

## 2020-05-12 DIAGNOSIS — Z125 Encounter for screening for malignant neoplasm of prostate: Secondary | ICD-10-CM | POA: Diagnosis not present

## 2020-07-29 DIAGNOSIS — R350 Frequency of micturition: Secondary | ICD-10-CM | POA: Diagnosis not present

## 2020-07-29 DIAGNOSIS — R351 Nocturia: Secondary | ICD-10-CM | POA: Diagnosis not present

## 2020-07-30 DIAGNOSIS — R6882 Decreased libido: Secondary | ICD-10-CM | POA: Diagnosis not present

## 2020-08-24 DIAGNOSIS — G43009 Migraine without aura, not intractable, without status migrainosus: Secondary | ICD-10-CM | POA: Diagnosis not present

## 2020-09-29 DIAGNOSIS — R351 Nocturia: Secondary | ICD-10-CM | POA: Diagnosis not present

## 2020-09-29 DIAGNOSIS — R350 Frequency of micturition: Secondary | ICD-10-CM | POA: Diagnosis not present

## 2021-04-20 DIAGNOSIS — G4700 Insomnia, unspecified: Secondary | ICD-10-CM | POA: Diagnosis not present

## 2021-04-20 DIAGNOSIS — G43009 Migraine without aura, not intractable, without status migrainosus: Secondary | ICD-10-CM | POA: Diagnosis not present

## 2021-06-06 ENCOUNTER — Encounter: Payer: Self-pay | Admitting: Neurology

## 2021-06-07 ENCOUNTER — Encounter: Payer: Self-pay | Admitting: Neurology

## 2021-06-07 ENCOUNTER — Ambulatory Visit: Payer: BC Managed Care – PPO | Admitting: Neurology

## 2021-06-07 VITALS — BP 149/88 | HR 93 | Ht 67.0 in | Wt 175.5 lb

## 2021-06-07 DIAGNOSIS — R0683 Snoring: Secondary | ICD-10-CM

## 2021-06-07 DIAGNOSIS — F901 Attention-deficit hyperactivity disorder, predominantly hyperactive type: Secondary | ICD-10-CM | POA: Diagnosis not present

## 2021-06-07 DIAGNOSIS — G4730 Sleep apnea, unspecified: Secondary | ICD-10-CM

## 2021-06-07 DIAGNOSIS — Z8659 Personal history of other mental and behavioral disorders: Secondary | ICD-10-CM

## 2021-06-07 DIAGNOSIS — F410 Panic disorder [episodic paroxysmal anxiety] without agoraphobia: Secondary | ICD-10-CM

## 2021-06-07 DIAGNOSIS — G478 Other sleep disorders: Secondary | ICD-10-CM | POA: Diagnosis not present

## 2021-06-07 NOTE — Progress Notes (Signed)
SLEEP MEDICINE CLINIC   Provider:  Larey Seat, M.D.   Primary Care Physician:  [name], MD   Referring Provider: [name], MD    Chief Complaint  Patient presents with   New Patient (Initial Visit)    pt alone, rm 11. pt states has had diffivulty with sleeping. he has a hard time stopping the brain from being on the go. smokes marijuana at bedtime and this allows him to fall asleep. -states that without it he can't sleep. Even after he gets a full 8 hours of sleep he still feels tired in AM . he has been told he snores. he wakes up  gasping for air and from his snoring.     06-07-2021:  Adam Trevino is a 35 y.o. male patient  who was last seen here on 04-04-2018  in a referral  from Dr. Joylene Draft for a sleep evaluation.   Chief complaint according to patient :06-07-2021:  new evaluation - Adam Trevino is a meanwhile 35&-year-old Caucasian right-handed gentleman who reports that he has trouble going to sleep and finding restful, restorative sleep. He is now married and his wfe has reported witnessing apneas, snoring and he coughs at night.   It seems to be not important how many hours he sleeps as he feels unrefreshed nonetheless, he has trouble to get up- he has PTSD, anxiety, feels paralyzed in the morning. His ADHD persists, of course.   He also reports that there are some days but he has a lot of energy alternating with those times where he has none.  He would like to see if this is related to a gradual worsening of his previously diagnosed  very MILD OSA- see 2019.  He also has late night eating habits and has frequent nocturia-  5 to 6 times !  He likes to be revaluated for a sleep disorder.   His sleep test revealed snoring , not apnea.  My RN had  called the patient after HST: ". I advised pt that Dr. Dr Domitila Stetler reviewed their sleep study results and found that pt has minimal/mild sleep apnea. Dr. Brett Fairy recommends that pt start a dental device and if he  didn't want to do that CPAP would be next option. I reviewed the dental device and what that would do as far as treatment for him and the process. Pt declines starting any treatment at this time. Pt verbalized understanding of results. Pt had no questions at this time but was encouraged to call back if questions arise. 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 He travels within the [place] region.  Pandemic  did not stop travel, each week he is on the road.  No jet lag, no time zone changes. He has no set schedule.  He skips meals, no meal times.  When he travels he will check into his hotel room and then goes for the next fastest available food.  At home he is usually home before 8 PM, he may not have had lunch he may have skipped breakfast on some days - dinnertime varies, usually fast food. There are 2 days each week where he does not travel and is actually at home. He smokes marijuana each night at 9-10 PM.  He aims to relax on the couch, sleeps there in  front of the TV, usually between 10 and 11 PM and between midnight and 1 he will go to his bedroom from there and continues to sleep. Bedroom is cool, quiet and dark. There is no TV in the bedroom.  He usually sleeps through until 6 AM. One bathroom break.  If not smoking pot :"my mind races so much " .  Sleep medical history and family sleep history:  married, he is unvaccinated, his wife is fully vaccinated- and he  Maternal uncle is insomnic. Father died when patient was 10, he had cancer at age 66-13 , osteosarcoma.  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 He was in psychiatric treatment for anxiety- Xanax ( [facility]) had to go to detox. He had cancer at age 20, chemotherapy: adriamycin destroyed  his hearing. 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 In summer he golfs- keeps active. First baby expected in 07-2021.  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   Review of Systems: Out of a complete 14 system review, the patient complains of only the following symptoms, and all other reviewed systems are negative.  He had cancer at age 59, chemotherapy: adriamycin destroyed his hearing. 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   ADHD< anxiety.  Psychiatrist - Chucky May, MD prescribed xanax. PTSD since Chemo-   Epworth Sleepiness Score : 5/ 24 points , Fatigue Severity Score:  37/ 63 points ,  depression score high.    Family History  Problem Relation Age of Onset   Colon cancer Mother    Lung cancer Father    Prostate cancer Maternal Grandfather    Heart attack Paternal Grandfather     Past Medical History:  Diagnosis Date   ADD (attention deficit disorder)    Anxiety    Osteogenic sarcoma ([place])    in L thigh age 51.   Snoring     Allergies as of 06/07/2021   (No Known Allergies)    Vitals:  BP (!) 149/88   Pulse 93   Ht 5\' 7"  (1.702 m)   Wt 175 lb 8 oz (79.6 kg)   BMI 27.49 kg/m  Last Weight:  Wt Readings from Last 1 Encounters:  06/07/21 175 lb 8 oz (79.6 kg)   DPO:EUMP mass index is 27.49 kg/m.     Last Height:   Ht Readings from Last 1 Encounters:  06/07/21 5\' 7"  (1.702 m)    Physical exam:  General: The patient is awake, alert and appears not in acute distress. The patient is well groomed. Head: Normocephalic, atraumatic. Neck is supple. Mallampati 2  neck circumference:16. 25 " . Nasal airflow patent ,facial hair. Retrognathia is not seen., but crowded lower jaw.  Cardiovascular:  Regular rate and rhythm, without  murmurs or carotid bruit, and without distended neck  veins. Respiratory: Lungs are clear to auscultation. Skin:  Without evidence of edema, or rash Trunk: BMI is 25.5  The patient's posture is erect    Neurologic exam : The patient is awake and alert, oriented to place and time.   Memory subjective  described as intact.    Attention span & concentration ability appears limited Speech is fluent,  without  dysarthria, dysphonia or aphasia.  Mood and affect are defensive, impulsive,  pressured speech  Cranial nerves: Pupils are equal and briskly reactive to light. Funduscopic exam without evidence of pallor or edema. Extraocular movements  in vertical and horizontal planes intact and without nystagmus. Visual fields by finger perimetry are intact. Hearing to finger rub intact.   Facial sensation intact to fine touch.  Facial motor strength is symmetric and tongue and uvula move midline. Shoulder shrug was symmetrical.   Motor exam:   Normal tone, muscle bulk and symmetric strength in all extremities. Sensory:  Fine touch, pinprick and vibration were tested in all extremities. Proprioception tested in the upper extremities was normal. Coordination:Finger-to-nose maneuver  normal without evidence of ataxia, dysmetria or tremor. Gait and station: Patient walks without assistive device and is able unassisted to climb up to the exam table. Strength within normal limits.  Stance is stable and normal.  Toe stand unfragmented. Turns with  3  Steps.  Deep tendon reflexes: in the  upper and lower extremities are symmetric and intact.    Assessment:  After physical and neurologic examination, review of laboratory studies,  Personal review of imaging studies, reports of other /same  Imaging studies, results of polysomnography and / or neurophysiology testing and pre-existing records as far as provided in visit., my assessment is :  1) I think that ADD, anxiety and marijuana all play a role in the cyclic manifestation of insomnia. His mind is racing-  marijuana is  calming. He mentioned a diagnosis of PTSD. He did well at age [age] with adderall/ ritalin and while he was able to concentrate  but he developed anxiety and in college started smoking pot at night.  2) Mild Sleep apnea. Is still a possibility - snoring, gasping and poor sleep quality. His wife now reports apneas - this is new!    PLAN :  1) order for another HST - watch pat. I asked patient to keep a journal about his sleep , and I like for him to give me a report about the test night as well.   He is not willing to quit marijuana but as he will get better sleep in the future , he may be able to cut down. His wife is very interested in this as well.  An evaluation by an Attention specialist may be in order.  He follows Dr Toy Care for anxiety, PTSD.     The patient was advised of the nature of the diagnosed disorder , the treatment options and the  risks for general health and wellness arising from not treating the condition.  I spent more than 35 minutes of face to face time with the patient.Greater than 50% of time was spent in counseling and coordination of care.   We have discussed the diagnosis and differential and I answered the patient's questions.    Larey Seat, MD 25/10/3974, 7:34 AM  Certified in Neurology by ABPN Certified in [place] by [facility] [address], [place], [place] [address]

## 2021-06-07 NOTE — Patient Instructions (Signed)

## 2021-06-08 DIAGNOSIS — Z125 Encounter for screening for malignant neoplasm of prostate: Secondary | ICD-10-CM | POA: Diagnosis not present

## 2021-06-08 DIAGNOSIS — E785 Hyperlipidemia, unspecified: Secondary | ICD-10-CM | POA: Diagnosis not present

## 2021-06-08 DIAGNOSIS — Z859 Personal history of malignant neoplasm, unspecified: Secondary | ICD-10-CM | POA: Diagnosis not present

## 2021-06-08 DIAGNOSIS — R7989 Other specified abnormal findings of blood chemistry: Secondary | ICD-10-CM | POA: Diagnosis not present

## 2021-06-08 DIAGNOSIS — Z0000 Encounter for general adult medical examination without abnormal findings: Secondary | ICD-10-CM | POA: Diagnosis not present

## 2021-06-10 DIAGNOSIS — Z1339 Encounter for screening examination for other mental health and behavioral disorders: Secondary | ICD-10-CM | POA: Diagnosis not present

## 2021-06-10 DIAGNOSIS — Z0000 Encounter for general adult medical examination without abnormal findings: Secondary | ICD-10-CM | POA: Diagnosis not present

## 2021-06-10 DIAGNOSIS — C419 Malignant neoplasm of bone and articular cartilage, unspecified: Secondary | ICD-10-CM | POA: Diagnosis not present

## 2021-06-10 DIAGNOSIS — R82998 Other abnormal findings in urine: Secondary | ICD-10-CM | POA: Diagnosis not present

## 2021-06-10 DIAGNOSIS — Z1331 Encounter for screening for depression: Secondary | ICD-10-CM | POA: Diagnosis not present

## 2021-06-10 DIAGNOSIS — G4733 Obstructive sleep apnea (adult) (pediatric): Secondary | ICD-10-CM | POA: Diagnosis not present

## 2021-06-30 ENCOUNTER — Telehealth: Payer: Self-pay | Admitting: Neurology

## 2021-06-30 NOTE — Telephone Encounter (Signed)
Called pt to schedule his sleep study but could not leave a message VM is full.

## 2021-07-20 ENCOUNTER — Ambulatory Visit: Payer: BC Managed Care – PPO | Admitting: Neurology

## 2021-07-21 ENCOUNTER — Telehealth: Payer: Self-pay | Admitting: Neurology

## 2021-07-21 NOTE — Telephone Encounter (Signed)
I will make the sleep lab aware that way they can see if when they download the test if there are any concerns. They will contact the patient if there is something wrong.

## 2021-07-21 NOTE — Telephone Encounter (Signed)
Re: pt's At Home Sleep test, he wants RN to know that he hopes it does not affect results but thru the night he switched fingers due to circulation issues thru the night, please call.

## 2021-08-03 ENCOUNTER — Ambulatory Visit (INDEPENDENT_AMBULATORY_CARE_PROVIDER_SITE_OTHER): Payer: BC Managed Care – PPO | Admitting: Neurology

## 2021-08-03 DIAGNOSIS — G4730 Sleep apnea, unspecified: Secondary | ICD-10-CM

## 2021-08-03 DIAGNOSIS — G478 Other sleep disorders: Secondary | ICD-10-CM

## 2021-08-03 DIAGNOSIS — Z8659 Personal history of other mental and behavioral disorders: Secondary | ICD-10-CM

## 2021-08-03 DIAGNOSIS — F901 Attention-deficit hyperactivity disorder, predominantly hyperactive type: Secondary | ICD-10-CM

## 2021-08-03 DIAGNOSIS — R0683 Snoring: Secondary | ICD-10-CM

## 2021-08-03 DIAGNOSIS — G4733 Obstructive sleep apnea (adult) (pediatric): Secondary | ICD-10-CM | POA: Diagnosis not present

## 2021-08-03 DIAGNOSIS — F410 Panic disorder [episodic paroxysmal anxiety] without agoraphobia: Secondary | ICD-10-CM

## 2021-08-04 NOTE — Progress Notes (Signed)
[facility] TEST REPORT ( by Watch PAT)   STUDY DATE:  08-04-2021   ORDERING CLINICIAN: Asencion Partridge Dohmeier,MD  REFERRING CLINICIAN: [name], MD    CLINICAL INFORMATION/HISTORY: 06-07-2021:  new evaluation - Mr. Adam Trevino is a meanwhile 6&-year-old Caucasian right-handed gentleman who reports that he has trouble going to sleep and finding restful, restorative sleep. He is now married and his wfe has reported witnessing apneas, snoring and he coughs at night.    It seems to be not important how many hours he sleeps as he feels unrefreshed nonetheless, he has trouble to get up- he has PTSD, anxiety, feels paralyzed in the morning. His ADHD persists, of course.   He also reports that there are some days but he has a lot of energy alternating with those times where he has none.  He would like to see if this is related to a gradual worsening of his previously diagnosed  very MILD OSA- see 2019.      Epworth sleepiness score: 5/24.   BMI: 27 kg/m   Neck Circumference: 16"   FINDINGS:   Sleep Summary:   Total Recording Time (hours, min): The total recording time for this home sleep test amounted to 7 hours 53 minutes of which 6 hours and 19 minutes with a total calculated sleep time.  REM sleep was present for 13.1% of sleep time.                             Respiratory Indices:   Calculated pAHI (per hour): 8.7/h                            REM pAHI:   2.5                                              NREM pAHI:   9.7                           Positional AHI:   Supine AHI was 12.5/h, on the right side AHI was 2.2/h .  So overall sleep in nonsupine sleep position did reduce sleep apnea to normal levels.                                                Oxygen Saturation Statistics:  O2 Saturation Range (%):     Between a nadir of 89 and a maximum of 100% with a mean saturation of 96%.                                  O2 Saturation (minutes) <89%:   0  minutes        Pulse Rate Statistics:            Pulse Range:   Between 55 and 114 bpm with a mean heart rate of 73 bpm.              IMPRESSION:  This HST confirms the presence of very mild sleep apnea  which was not REM sleep dependent and seem to be mostly dependent on sleep position.   This home sleep test would not give me indication how often the patient will by coughing.   There is a very fragmented sleep pattern noted on the hypnogram.  RECOMMENDATION: I would be hesitant to recommend CPAP treatment for this patient and rather would like to work on him changing his preferred sleep position to the right side.  For this a device by [employer] called " sleep balance" can be very helpful. The patient may also consider a dental device which will help to control snoring and mild apnea as well. It may not do anything for coughing at night.     INTERPRETING PHYSICIAN:   Larey Seat, MD   Medical Director of [facility] at [facility].

## 2021-08-21 DIAGNOSIS — G478 Other sleep disorders: Secondary | ICD-10-CM | POA: Insufficient documentation

## 2021-08-21 DIAGNOSIS — R0683 Snoring: Secondary | ICD-10-CM | POA: Insufficient documentation

## 2021-08-21 DIAGNOSIS — Z8659 Personal history of other mental and behavioral disorders: Secondary | ICD-10-CM | POA: Insufficient documentation

## 2021-08-21 DIAGNOSIS — F901 Attention-deficit hyperactivity disorder, predominantly hyperactive type: Secondary | ICD-10-CM | POA: Insufficient documentation

## 2021-08-21 DIAGNOSIS — G4730 Sleep apnea, unspecified: Secondary | ICD-10-CM | POA: Insufficient documentation

## 2021-08-21 NOTE — Progress Notes (Signed)
IMPRESSION:  This HST confirms the presence of very mild sleep apnea which was not REM sleep dependent and seem to be mostly dependent on sleep position.   This home sleep test would not give me indication how often the patient may have been coughing.  There is a very fragmented sleep pattern noted on the hypnogram.  RECOMMENDATION: I would be hesitant to recommend CPAP treatment for this patient with such mild apnea and rather  like to work with him on changing his preferred sleep position to the right side.  For this, a device by [employer] called "sleep balance" can be very helpful. (The tennis ball method is the classic recommendation.)   The patient may also consider a dental device which will help to control snoring and mild apnea as well. It may not do anything for coughing at night. I would be happy to refer to sleep dentistry.

## 2021-08-21 NOTE — Procedures (Signed)
[facility] TEST REPORT ( by Watch PAT)   STUDY DATE:  08-04-2021   ORDERING CLINICIAN: Asencion Partridge Allyana Vogan,MD  REFERRING CLINICIAN: [name], MD    CLINICAL INFORMATION/HISTORY: 06-07-2021:  new evaluation - Mr. Adam Trevino is a meanwhile 67&-year-old Caucasian right-handed gentleman who reports that he has trouble going to sleep and finding restful, restorative sleep. He is now married and his wfe has reported witnessing apneas, snoring and he coughs at night.    It seems to be not important how many hours he sleeps as he feels unrefreshed nonetheless, he has trouble to get up- he has PTSD, anxiety, feels paralyzed in the morning. His ADHD persists, of course.   He also reports that there are some days but he has a lot of energy alternating with those times where he has none.  He would like to see if this is related to a gradual worsening of his previously diagnosed  very MILD OSA- see 2019.      Epworth sleepiness score: 5/24.   BMI: 27 kg/m   Neck Circumference: 16"   FINDINGS:   Sleep Summary:   Total Recording Time (hours, min): The total recording time for this home sleep test amounted to 7 hours 53 minutes of which 6 hours and 19 minutes with a total calculated sleep time.  REM sleep was present for 13.1% of sleep time.                             Respiratory Indices:   Calculated pAHI (per hour): 8.7/h                            REM pAHI:   2.5                                              NREM pAHI:   9.7                           Positional AHI:   Supine AHI was 12.5/h, on the right side AHI was 2.2/h .  So overall sleep in nonsupine sleep position did reduce sleep apnea to normal levels.                                                Oxygen Saturation Statistics:  O2 Saturation Range (%):     Between a nadir of 89 and a maximum of 100% with a mean saturation of 96%.                                  O2 Saturation (minutes) <89%:   0 minutes         Pulse Rate Statistics:            Pulse Range:   Between 55 and 114 bpm with a mean heart rate of 73 bpm.              IMPRESSION:  This HST confirms the presence of very mild sleep apnea which was  not REM sleep dependent and seem to be mostly dependent on sleep position.   This home sleep test would not give me indication how often the patient will by coughing.   There is a very fragmented sleep pattern noted on the hypnogram.  RECOMMENDATION: I would be hesitant to recommend CPAP treatment for this patient and rather would like to work on him changing his preferred sleep position to the right side.  For this a device by [employer] called " sleep balance" can be very helpful. The patient may also consider a dental device which will help to control snoring and mild apnea as well. It may not do anything for coughing at night.     INTERPRETING PHYSICIAN:   Larey Seat, MD   Medical Director of [facility] at [facility].

## 2021-08-23 ENCOUNTER — Telehealth: Payer: Self-pay | Admitting: *Deleted

## 2021-08-23 NOTE — Telephone Encounter (Signed)
LVM for pt to call about results. °

## 2021-08-23 NOTE — Telephone Encounter (Signed)
-----   Message from Larey Seat, MD sent at 08/21/2021  7:16 PM EST ----- IMPRESSION:  This HST confirms the presence of very mild sleep apnea which was not REM sleep dependent and seem to be mostly dependent on sleep position.   This home sleep test would not give me indication how often the patient may have been coughing.  There is a very fragmented sleep pattern noted on the hypnogram.  RECOMMENDATION: I would be hesitant to recommend CPAP treatment for this patient with such mild apnea and rather  like to work with him on changing his preferred sleep position to the right side.  For this, a device by [employer] called "sleep balance" can be very helpful. (The tennis ball method is the classic recommendation.)   The patient may also consider a dental device which will help to control snoring and mild apnea as well. It may not do anything for coughing at night. I would be happy to refer to sleep dentistry.

## 2021-08-25 NOTE — Telephone Encounter (Signed)
Took call from phone staff and spoke with pt. Relayed results. He cannot lay on R side. Broke collar bone when he was younger, too uncomfortable. Has to sleep on back. Wife is dentist, can speak with her about dental device. But wants to see if Dr. Brett Fairy thinks he would qualify for cpap based on data from HST. This is route he would refer to go if she approves. Aware I will speak with her and call him back early next week. He verbalized understanding. ?

## 2021-08-29 ENCOUNTER — Other Ambulatory Visit: Payer: Self-pay | Admitting: Neurology

## 2021-08-29 DIAGNOSIS — G4730 Sleep apnea, unspecified: Secondary | ICD-10-CM

## 2021-08-29 DIAGNOSIS — R0683 Snoring: Secondary | ICD-10-CM

## 2021-08-29 NOTE — Telephone Encounter (Signed)
Pt overall AHI is 8.7. insurance should allow for Auto CPAP. ?Will place a order for auto CPAP 5-12 cm water pressure with 2 cm EPR. Mask of patient's choice.  I reviewed PAP compliance expectations with the pt. Pt is agreeable to starting a CPAP. I advised pt that an order will be sent to a DME, [facility], and [facility] will call the pt within about one week after they file with the pt's insurance. [facility] will show the pt how to use the machine, fit for masks, and troubleshoot the CPAP if needed. A follow up appt was made for insurance purposes with Dr. Brett Fairy on May 24,2023 at 3:30 pm. Pt verbalized understanding to arrive 15 minutes early and bring their CPAP. A letter with all of this information in it will be mailed to the pt as a reminder. I verified with the pt that the address we have on file is correct. Pt verbalized understanding of results. Pt had no questions at this time but was encouraged to call back if questions arise. I have sent the order to [facility] and have received confirmation that they have received the order. ? ?

## 2021-10-18 ENCOUNTER — Telehealth: Payer: Self-pay

## 2021-10-18 NOTE — Telephone Encounter (Signed)
Received letter by fax stating DME has contacted pt numerous times to get scheduled and has not gotten a response. Order will be voided at this time.  ?

## 2021-10-19 ENCOUNTER — Telehealth: Payer: Self-pay

## 2021-10-19 NOTE — Telephone Encounter (Signed)
Attempted to reach pt, VM full and unable to leave VM. Called wife at [phone] and LVM for pt to return call.  ? ?Since pt has not started CPAP, initial CPAP appt sch for 10/26/21 can be cx. Per previous telephone note on 10/18/21. [facility] has tried to reach pt multiple times. If pt wishes to start CPAP, he needs to reach out to [facility] and sch an appt to get set up. We would then need him to call us once set up to re sch his initial CPAP appt.  ?

## 2021-10-26 ENCOUNTER — Ambulatory Visit: Payer: BC Managed Care – PPO | Admitting: Neurology

## 2021-11-16 ENCOUNTER — Ambulatory Visit: Payer: BC Managed Care – PPO | Admitting: Neurology

## 2021-12-21 DIAGNOSIS — M25512 Pain in left shoulder: Secondary | ICD-10-CM | POA: Diagnosis not present

## 2021-12-21 DIAGNOSIS — M25572 Pain in left ankle and joints of left foot: Secondary | ICD-10-CM | POA: Diagnosis not present

## 2021-12-29 DIAGNOSIS — R269 Unspecified abnormalities of gait and mobility: Secondary | ICD-10-CM | POA: Diagnosis not present

## 2022-01-11 DIAGNOSIS — Z800 Family history of malignant neoplasm of digestive organs: Secondary | ICD-10-CM | POA: Diagnosis not present

## 2022-01-11 DIAGNOSIS — K648 Other hemorrhoids: Secondary | ICD-10-CM | POA: Diagnosis not present

## 2022-01-11 DIAGNOSIS — K5730 Diverticulosis of large intestine without perforation or abscess without bleeding: Secondary | ICD-10-CM | POA: Diagnosis not present

## 2022-01-11 DIAGNOSIS — Z1211 Encounter for screening for malignant neoplasm of colon: Secondary | ICD-10-CM | POA: Diagnosis not present

## 2022-01-11 DIAGNOSIS — D123 Benign neoplasm of transverse colon: Secondary | ICD-10-CM | POA: Diagnosis not present

## 2022-01-11 DIAGNOSIS — D125 Benign neoplasm of sigmoid colon: Secondary | ICD-10-CM | POA: Diagnosis not present

## 2022-01-17 ENCOUNTER — Ambulatory Visit: Payer: BC Managed Care – PPO | Admitting: Podiatry

## 2022-01-31 ENCOUNTER — Encounter: Payer: Self-pay | Admitting: Podiatry

## 2022-01-31 ENCOUNTER — Ambulatory Visit: Payer: BC Managed Care – PPO | Admitting: Podiatry

## 2022-01-31 DIAGNOSIS — B351 Tinea unguium: Secondary | ICD-10-CM | POA: Diagnosis not present

## 2022-01-31 DIAGNOSIS — L603 Nail dystrophy: Secondary | ICD-10-CM

## 2022-01-31 NOTE — Progress Notes (Signed)
  Subjective:  Patient ID: Adam Trevino, male    DOB: [birth year],  MRN: [id number] HPI Chief Complaint  Patient presents with   FOOT FUNGUS     L great toe, poss toe fungus, X1 year, previous injury caused appearance to change     36 y.o. male presents with the above complaint.   ROS: Denies fever chills nausea vomit muscle aches pains calf pain back pain chest pain shortness of breath.  Past Medical History:  Diagnosis Date   ADD (attention deficit disorder)    Anxiety    Osteogenic sarcoma ([place])    in L thigh age 59.   Snoring    Past Surgical History:  Procedure Laterality Date   cadaver bone     surgery for left thigh cancer   osteosarcoma Left    WISDOM TOOTH EXTRACTION      Current Outpatient Medications:    alfuzosin (UROXATRAL) 10 MG 24 hr tablet, Take 10 mg by mouth daily., Disp: , Rfl:    ALPRAZolam (XANAX) 1 MG tablet, Take by mouth., Disp: , Rfl:    amitriptyline (ELAVIL) 50 MG tablet, TAKE 1 TABLET(50 MG) BY MOUTH AT BEDTIME, Disp: , Rfl:    famotidine (PEPCID) 20 MG tablet, Take 20 mg by mouth daily., Disp: , Rfl:    finasteride (PROSCAR) 5 MG tablet, Take 5 mg by mouth daily., Disp: , Rfl:    Methylfol-Methylcob-Acetylcyst (CEREFOLIN NAC PO), Take 1 tablet by mouth daily., Disp: , Rfl:    Multiple Vitamin (MULTIVITAMIN) capsule, Take 1 capsule by mouth daily., Disp: , Rfl:    SUMAtriptan (IMITREX) 100 MG tablet, TAKE 1 TABLET BY MOUTH AT ONSET OF HEADACHE. MAY REPEAT IN 2 HOURS IF THE HEADACHE PAIN PERSISTS. NO MORE THAN 2 TABLETS IN A 24 HOUR PERIOD, Disp: , Rfl:   No Known Allergies Review of Systems Objective:  There were no vitals filed for this visit.  General: Well developed, nourished, in no acute distress, alert and oriented x3   Dermatological: Skin is warm, dry and supple bilateral. Nails x 10 are well maintained; remaining integument appears unremarkable at this time. There are no open sores, no preulcerative lesions, no rash or signs of  infection present.  Hallux left does demonstrate some blood beneath the nail also demonstrates distal onycholysis and thickening of the nail.  Vascular: Dorsalis Pedis artery and Posterior Tibial artery pedal pulses are 2/4 bilateral with immedate capillary fill time. Pedal hair growth present. No varicosities and no lower extremity edema present bilateral.   Neruologic: Grossly intact via light touch bilateral. Vibratory intact via tuning fork bilateral. Protective threshold with Semmes Wienstein monofilament intact to all pedal sites bilateral. Patellar and Achilles deep tendon reflexes 2+ bilateral. No Babinski or clonus noted bilateral.   Musculoskeletal: No gross boney pedal deformities bilateral. No pain, crepitus, or limitation noted with foot and ankle range of motion bilateral. Muscular strength 5/5 in all groups tested bilateral.  Gait: Unassisted, Nonantalgic.    Radiographs:  None taken  Assessment & Plan:   Assessment: Nail dystrophy hallux left  Plan: Samples of skin and nail were taken today for pathologic evaluation.  In 1 month     Zamyah Wiesman T. [place], [place]

## 2022-02-01 ENCOUNTER — Ambulatory Visit: Payer: BC Managed Care – PPO | Admitting: Podiatry

## 2022-03-07 ENCOUNTER — Ambulatory Visit: Payer: BC Managed Care – PPO | Admitting: Podiatry

## 2022-03-14 ENCOUNTER — Ambulatory Visit: Payer: BC Managed Care – PPO | Admitting: Podiatry

## 2022-03-21 ENCOUNTER — Ambulatory Visit: Payer: BC Managed Care – PPO | Admitting: Podiatry

## 2022-03-21 DIAGNOSIS — Z79899 Other long term (current) drug therapy: Secondary | ICD-10-CM

## 2022-03-21 DIAGNOSIS — L603 Nail dystrophy: Secondary | ICD-10-CM

## 2022-03-21 MED ORDER — TERBINAFINE HCL 250 MG PO TABS: 250.0000 mg | ORAL_TABLET | Freq: Every day | ORAL | 0 refills | Status: DC

## 2022-03-21 NOTE — Progress Notes (Signed)
He presents today for follow-up of his nail fungus.  Objective: Vital signs are stable alert oriented x3.  Pulses are palpable.  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  Also demonstrates nail dystrophy.  Assessment: Onychomycosis.  Plan: Started him on Lamisil today 250 mg tablets 1 p.o. daily for the next 30 days.  Also requesting comprehensive metabolic panel.  Should this be abnormal I will notify him immediately.  We did discuss pros and cons of use of this medication possible side effects associated with this drug he understands this middle toe will take the risk we did also discuss the possibility of laser therapy explained to him that there are no topicals that would work on this organism.

## 2022-04-11 ENCOUNTER — Ambulatory Visit: Payer: BC Managed Care – PPO | Admitting: Podiatry

## 2022-05-25 ENCOUNTER — Ambulatory Visit: Payer: BC Managed Care – PPO | Admitting: Podiatry

## 2022-06-14 DIAGNOSIS — N3281 Overactive bladder: Secondary | ICD-10-CM | POA: Diagnosis not present

## 2022-06-14 DIAGNOSIS — R350 Frequency of micturition: Secondary | ICD-10-CM | POA: Diagnosis not present

## 2022-06-28 DIAGNOSIS — R7989 Other specified abnormal findings of blood chemistry: Secondary | ICD-10-CM | POA: Diagnosis not present

## 2022-06-28 DIAGNOSIS — Z0000 Encounter for general adult medical examination without abnormal findings: Secondary | ICD-10-CM | POA: Diagnosis not present

## 2022-07-03 DIAGNOSIS — L237 Allergic contact dermatitis due to plants, except food: Secondary | ICD-10-CM | POA: Diagnosis not present

## 2022-07-03 DIAGNOSIS — N401 Enlarged prostate with lower urinary tract symptoms: Secondary | ICD-10-CM | POA: Diagnosis not present

## 2022-07-03 DIAGNOSIS — R3121 Asymptomatic microscopic hematuria: Secondary | ICD-10-CM | POA: Diagnosis not present

## 2022-07-03 DIAGNOSIS — Z0000 Encounter for general adult medical examination without abnormal findings: Secondary | ICD-10-CM | POA: Diagnosis not present

## 2022-07-03 DIAGNOSIS — R82998 Other abnormal findings in urine: Secondary | ICD-10-CM | POA: Diagnosis not present

## 2022-07-03 DIAGNOSIS — Z1331 Encounter for screening for depression: Secondary | ICD-10-CM | POA: Diagnosis not present

## 2022-07-03 DIAGNOSIS — Z800 Family history of malignant neoplasm of digestive organs: Secondary | ICD-10-CM | POA: Diagnosis not present

## 2022-07-25 ENCOUNTER — Ambulatory Visit: Payer: BC Managed Care – PPO | Admitting: Podiatry

## 2022-07-26 DIAGNOSIS — R350 Frequency of micturition: Secondary | ICD-10-CM | POA: Diagnosis not present

## 2022-07-26 DIAGNOSIS — N3281 Overactive bladder: Secondary | ICD-10-CM | POA: Diagnosis not present

## 2022-07-26 DIAGNOSIS — R351 Nocturia: Secondary | ICD-10-CM | POA: Diagnosis not present

## 2022-08-15 ENCOUNTER — Ambulatory Visit (INDEPENDENT_AMBULATORY_CARE_PROVIDER_SITE_OTHER): Payer: BC Managed Care – PPO | Admitting: *Deleted

## 2022-08-15 DIAGNOSIS — L603 Nail dystrophy: Secondary | ICD-10-CM

## 2022-08-15 DIAGNOSIS — Z79899 Other long term (current) drug therapy: Secondary | ICD-10-CM

## 2022-08-15 MED ORDER — TERBINAFINE HCL 250 MG PO TABS: 250.0000 mg | ORAL_TABLET | Freq: Every day | ORAL | 0 refills | Status: DC

## 2022-08-15 NOTE — Patient Instructions (Signed)
Terbinafine Tablets What is this medication? TERBINAFINE (TER bin a feen) treats fungal infections of the nails. It belongs to a group of medications called antifungals. It will not treat infections caused by bacteria or viruses. This medicine may be used for other purposes; ask your health care provider or pharmacist if you have questions. COMMON BRAND NAME(S): Lamisil, Terbinex What should I tell my care team before I take this medication? They need to know if you have any of these conditions: Liver disease An unusual or allergic reaction to terbinafine, other medications, foods, dyes, or preservatives Pregnant or trying to get pregnant Breast-feeding How should I use this medication? Take this medication by mouth with water. Take it as directed on the prescription label at the same time every day. You can take it with or without food. If it upsets your stomach, take it with food. Keep taking it unless your care team tells you to stop. A special MedGuide will be given to you by the pharmacist with each prescription and refill. Be sure to read this information carefully each time. Talk to your care team regarding the use of this medication in children. Special care may be needed. Overdosage: If you think you have taken too much of this medicine contact a poison control center or emergency room at once. NOTE: This medicine is only for you. Do not share this medicine with others. What if I miss a dose? If you miss a dose, take it as soon as you can unless it is more than 4 hours late. If it is more than 4 hours late, skip the missed dose. Take the next dose at the normal time. What may interact with this medication? Do not take this medication with any of the following: Pimozide Thioridazine This medication may also interact with the following: Beta blockers Caffeine Certain medications for mental health conditions Cimetidine Cyclosporine Medications for fungal infections like fluconazole  and ketoconazole Medications for irregular heartbeat like amiodarone, flecainide and propafenone Rifampin Warfarin This list may not describe all possible interactions. Give your health care provider a list of all the medicines, herbs, non-prescription drugs, or dietary supplements you use. Also tell them if you smoke, drink alcohol, or use illegal drugs. Some items may interact with your medicine. What should I watch for while using this medication? Visit your care team for regular checks on your progress. You may need blood work while you are taking this medication. It may be some time before you see the benefit from this medication. This medication may cause serious skin reactions. They can happen weeks to months after starting the medication. Contact your care team right away if you notice fevers or flu-like symptoms with a rash. The rash may be red or purple and then turn into blisters or peeling of the skin. Or, you might notice a red rash with swelling of the face, lips or lymph nodes in your neck or under your arms. This medication can make you more sensitive to the sun. Keep out of the sun, If you cannot avoid being in the sun, wear protective clothing and sunscreen. Do not use sun lamps or tanning beds/booths. What side effects may I notice from receiving this medication? Side effects that you should report to your care team as soon as possible: Allergic reactions--skin rash, itching, hives, swelling of the face, lips, tongue, or throat Change in sense of smell Change in taste Infection--fever, chills, cough, or sore throat Liver injury--right upper belly pain, loss of appetite, nausea,  light-colored stool, dark yellow or brown urine, yellowing skin or eyes, unusual weakness or fatigue Low red blood cell level--unusual weakness or fatigue, dizziness, headache, trouble breathing Lupus-like syndrome--joint pain, swelling, or stiffness, butterfly-shaped rash on the face, rashes that get worse  in the sun, fever, unusual weakness or fatigue Rash, fever, and swollen lymph nodes Redness, blistering, peeling, or loosening of the skin, including inside the mouth Unusual bruising or bleeding Worsening mood, feelings of depression Side effects that usually do not require medical attention (report to your care team if they continue or are bothersome): Diarrhea Gas Headache Nausea Stomach pain Upset stomach This list may not describe all possible side effects. Call your doctor for medical advice about side effects. You may report side effects to FDA at 1-800-FDA-1088. Where should I keep my medication? Keep out of the reach of children and pets. Store between 20 and 25 degrees C (68 and 77 degrees F). Protect from light. Get rid of any unused medication after the expiration date. To get rid of medications that are no longer needed or have expired: Take the medication to a medication take-back program. Check with your pharmacy or law enforcement to find a location. If you cannot return the medication, check the label or package insert to see if the medication should be thrown out in the garbage or flushed down the toilet. If you are not sure, ask your care team. If it is safe to put it in the trash, take the medication out of the container. Mix the medication with cat litter, dirt, coffee grounds, or other unwanted substance. Seal the mixture in a bag or container. Put it in the trash. NOTE: This sheet is a summary. It may not cover all possible information. If you have questions about this medicine, talk to your doctor, pharmacist, or health care provider.  2023 Elsevier/Gold Standard (2021-01-04 00:00:00)

## 2022-08-15 NOTE — Progress Notes (Signed)
He presents today for follow-up of his nail fungus. He has been taking lamisil.  Patient states he just got his initially blood work done early January and started the first 30 days around January 13th. He hasn't had any side effects to the medication.   Assessment: Onychomycosis.  I will give him another set of labs today for a CMP and send in a 90 day supply of lamisil. He will follow up with Dr. Milinda Pointer in 4 months.

## 2022-08-24 ENCOUNTER — Inpatient Hospital Stay: Payer: BC Managed Care – PPO | Admitting: Genetic Counselor

## 2022-08-24 ENCOUNTER — Other Ambulatory Visit: Payer: Self-pay

## 2022-08-24 ENCOUNTER — Inpatient Hospital Stay: Payer: BC Managed Care – PPO

## 2022-08-24 ENCOUNTER — Encounter: Payer: Self-pay | Admitting: Genetic Counselor

## 2022-08-24 DIAGNOSIS — Z85831 Personal history of malignant neoplasm of soft tissue: Secondary | ICD-10-CM | POA: Diagnosis not present

## 2022-08-24 DIAGNOSIS — Z8042 Family history of malignant neoplasm of prostate: Secondary | ICD-10-CM

## 2022-08-24 DIAGNOSIS — C4020 Malignant neoplasm of long bones of unspecified lower limb: Secondary | ICD-10-CM

## 2022-08-24 DIAGNOSIS — Z800 Family history of malignant neoplasm of digestive organs: Secondary | ICD-10-CM

## 2022-08-24 LAB — GENETIC SCREENING ORDER

## 2022-08-24 NOTE — Progress Notes (Signed)
REFERRING PROVIDER: [name], MD [place],  [place] [id number]  PRIMARY PROVIDER:  [name], MD  PRIMARY REASON FOR VISIT:  1. Osteosarcoma of femur, unspecified laterality ([place])   2. Family history of prostate cancer   3. Family history of colon cancer    HISTORY OF PRESENT ILLNESS:   Mr. Cuvelier, a 37 y.o. male, was seen for a [facility] cancer genetics consultation at the request of Dr. Joylene Draft due to a personal and family history of cancer.  Mr. Sendejo presents to clinic today to discuss the possibility of a hereditary predisposition to cancer, to discuss genetic testing, and to further clarify his future cancer risks, as well as potential cancer risks for family members.   CANCER HISTORY:  Mr. Colyer was diagnosed with osteosarcoma of the femur at age 43.  Past Medical History:  Diagnosis Date   ADD (attention deficit disorder)    Anxiety    Osteogenic sarcoma ([place])    in L thigh age 68.   Snoring     Past Surgical History:  Procedure Laterality Date   cadaver bone     surgery for left thigh cancer   osteosarcoma Left    WISDOM TOOTH EXTRACTION      Social History   Socioeconomic History   Marital status: Single    Spouse name: Not on file   Number of children: Not on file   Years of education: Not on file   Highest education level: Not on file  Occupational History   Not on file  Tobacco Use   Smoking status: Never   Smokeless tobacco: Never   Tobacco comments:    smokes marijuana  Substance and Sexual Activity   Alcohol use: Yes    Comment: socially   Drug use: Yes    Types: Marijuana   Sexual activity: Yes    Partners: Female  Other Topics Concern   Not on file  Social History Narrative   Not on file   Social Determinants of Health   Financial Resource Strain: Not on file  Food Insecurity: Not on file  Transportation Needs: Not on file  Physical Activity: Not on file  Stress: Not on file  Social Connections: Not on file      FAMILY HISTORY:  We obtained a detailed, 4-generation family history.  Significant diagnoses are listed below: Family History  Problem Relation Age of Onset   Colon cancer Mother 31   Lung cancer Father 37   Throat cancer Maternal Grandmother 80 - 85       smoked   Prostate cancer Maternal Grandfather 73   Heart attack Paternal Grandfather      Mr. Doerflein mother was diagnosed with colon cancer at age 72. His maternal grandmother was diagnosed with throat cancer in her 36s, she smoked and died at age 64. His maternal grandfather was diagnosed with prostate cancer at age 24, he died at age 39. Mr. Montford father was diagnosed with lung cancer at age 58, he died at age 38. Mr. Mancil is unaware of previous family history of genetic testing for hereditary cancer risks. There is no reported Ashkenazi Jewish ancestry.   GENETIC COUNSELING ASSESSMENT: Mr. Hartel is a 37 y.o. male with a personal and family history of cancer which is somewhat suggestive of a hereditary predisposition to cancer. We, therefore, discussed and recommended the following at today's visit.   DISCUSSION: We discussed that 5 - 10% of cancer is hereditary, with most cases of colon  cancer associated with Lynch Syndrome.  There are other genes that can be associated with hereditary colon cancer syndromes. We discussed that testing is beneficial for several reasons, including knowing about other cancer risks, identifying potential screening and risk-reduction options that may be appropriate, and to understanding if other family members could be at risk for cancer and allowing them to undergo genetic testing.  We reviewed the characteristics, features and inheritance patterns of hereditary cancer syndromes. We also discussed genetic testing, including the appropriate family members to test, the process of testing, insurance coverage and turn-around-time for results. We discussed the implications of a negative, positive, carrier  and/or variant of uncertain significant result. We recommended Mr. Klym pursue genetic testing for a panel that includes genes associated with osteosarcoma, colon cancer, and prostate cancer.   Mr. Garverick  was offered a common hereditary cancer panel (47 genes) and an expanded pan-cancer panel (71 genes). Mr. Harpenau was informed of the benefits and limitations of each panel, including that expanded pan-cancer panels contain genes that do not have clear management guidelines at this point in time.  We also discussed that as the number of genes included on a panel increases, the chances of variants of uncertain significance increases. After considering the benefits and limitations of each gene panel, Mr. Beaver elected to have [facility] CancerNext-Expanded Panel.  The CancerNext-Expanded gene panel offered by [facility] and includes sequencing, rearrangement, and RNA analysis for the following 71 genes: AIP, ALK, APC, ATM, AXIN2, BAP1, BARD1, BMPR1A, BRCA1, BRCA2, BRIP1, CDC73, CDH1, CDK4, CDKN1B, CDKN2A, CHEK2, CTNNA1, DICER1, FH, FLCN, KIF1B, LZTR1, MAX, MEN1, MET, MLH1, MSH2, MSH3, MSH6, MUTYH, NF1, NF2, NTHL1, PALB2, PHOX2B, PMS2, POT1, PRKAR1A, PTCH1, PTEN, RAD51C, RAD51D, RB1, RET, SDHA, SDHAF2, SDHB, SDHC, SDHD, SMAD4, SMARCA4, SMARCB1, SMARCE1, STK11, SUFU, TMEM127, TP53, TSC1, TSC2, and VHL (sequencing and deletion/duplication); EGFR, EGLN1, HOXB13, KIT, MITF, PDGFRA, POLD1, and POLE (sequencing only); EPCAM and GREM1 (deletion/duplication only).   Based on Mr. Bernales personal and family history of cancer, he meets medical criteria for genetic testing. Despite that he meets criteria, he may still have an out of pocket cost. We discussed that if his out of pocket cost for testing is over $100, the laboratory will call and confirm whether he wants to proceed with testing.  If the out of pocket cost of testing is less than $100 he will be billed by the genetic testing laboratory.   PLAN: After  considering the risks, benefits, and limitations, Mr. Schaver provided informed consent to pursue genetic testing and the blood sample was sent to [facility] for analysis of the CancerNext-Expanded Panel. Results should be available within approximately 2-3 weeks' time, at which point they will be disclosed by telephone to Mr. Rieth, as will any additional recommendations warranted by these results. Mr. Reeves will receive a summary of his genetic counseling visit and a copy of his results once available. This information will also be available in Epic.   Mr. Cort questions were answered to his satisfaction today. Our contact information was provided should additional questions or concerns arise. Thank you for the referral and allowing [place] to share in the care of your patient.   Lucille Passy, MS, [facility] Genetic Counselor [email or web address] (P) [phone]  The patient was seen for a total of 25 minutes in face-to-face genetic counseling. The patient was seen alone.  Drs. Lindi Adie and/or Burr Medico were available to discuss this case as needed.   _______________________________________________________________________ For Office Staff:  Number of people involved in session:  1 Was an Intern/ student involved with case: no

## 2022-09-21 ENCOUNTER — Encounter: Payer: Self-pay | Admitting: Genetic Counselor

## 2022-09-21 ENCOUNTER — Telehealth: Payer: Self-pay | Admitting: Genetic Counselor

## 2022-09-21 DIAGNOSIS — Z1379 Encounter for other screening for genetic and chromosomal anomalies: Secondary | ICD-10-CM | POA: Insufficient documentation

## 2022-09-21 NOTE — Telephone Encounter (Signed)
I attempted to contact Mr. Adam Trevino to discuss his genetic testing results (71 genes). I left a voicemail requesting he call me back at [phone].  Lucille Passy, MS, [facility] Genetic Counselor [email or web address] (P) [phone]

## 2023-01-15 ENCOUNTER — Encounter: Payer: Self-pay | Admitting: Genetic Counselor

## 2023-01-15 ENCOUNTER — Ambulatory Visit: Payer: Self-pay | Admitting: Genetic Counselor

## 2023-01-15 DIAGNOSIS — Z1379 Encounter for other screening for genetic and chromosomal anomalies: Secondary | ICD-10-CM

## 2023-01-15 NOTE — Progress Notes (Signed)
HPI:   Adam Trevino was previously seen in the [facility] due to a personal and family history of cancer and concerns regarding a hereditary predisposition to cancer. Please refer to our prior cancer genetics clinic note for more information regarding our discussion, assessment and recommendations, at the time. After multiple attempts, we were unable to get in contact with Mr. Ursua to disclose his results. We will mail him a copy of his results and clinic note. His results and recommendations are discussed in detail below.  FAMILY HISTORY:  We obtained a detailed, 4-generation family history.  Significant diagnoses are listed below:      Family History  Problem Relation Age of Onset   Colon cancer Mother 80   Lung cancer Father 38   Throat cancer Maternal Grandmother 50 June 09, 1984        smoked   Prostate cancer Maternal Grandfather 74   Heart attack Paternal Grandfather             Mr. Ida mother was diagnosed with colon cancer at age 64. His maternal grandmother was diagnosed with throat cancer in her 70s, she smoked and died at age 52. His maternal grandfather was diagnosed with prostate cancer at age 83, he died at age 13. Mr. Borboa father was diagnosed with lung cancer at age 31, he died at age 55. Mr. Posten is unaware of previous family history of genetic testing for hereditary cancer risks. There is no reported Ashkenazi Jewish ancestry.    GENETIC TEST RESULTS:  The [facility] CancerNext-Expanded Panel found no pathogenic mutations.   The CancerNext-Expanded gene panel offered by [facility] and includes sequencing, rearrangement, and RNA analysis for the following 71 genes: AIP, ALK, APC, ATM, AXIN2, BAP1, BARD1, BMPR1A, BRCA1, BRCA2, BRIP1, CDC73, CDH1, CDK4, CDKN1B, CDKN2A, CHEK2, CTNNA1, DICER1, FH, FLCN, KIF1B, LZTR1, MAX, MEN1, MET, MLH1, MSH2, MSH3, MSH6, MUTYH, NF1, NF2, NTHL1, PALB2, PHOX2B, PMS2, POT1, PRKAR1A, PTCH1, PTEN, RAD51C, RAD51D, RB1, RET,  SDHA, SDHAF2, SDHB, SDHC, SDHD, SMAD4, SMARCA4, SMARCB1, SMARCE1, STK11, SUFU, TMEM127, TP53, TSC1, TSC2, and VHL (sequencing and deletion/duplication); EGFR, EGLN1, HOXB13, KIT, MITF, PDGFRA, POLD1, and POLE (sequencing only); EPCAM and GREM1 (deletion/duplication only).   The test report has been scanned into EPIC and is located under the Molecular Pathology section of the Results Review tab.  A portion of the result report is included below for reference. Genetic testing reported out on 09/21/2022.       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  At this time, it is unknown if this variant is associated with an increased risk for cancer or if it is benign, but most uncertain variants are reclassified to benign. It should not be used to make medical management decisions. With time, we suspect the laboratory will determine the significance of this variant, if any. If the laboratory reclassifies this variant, we will attempt to contact Mr. Sansone to discuss it further.   Even though a pathogenic variant was not identified, possible explanations for the cancer in the family may include: There may be no hereditary risk for cancer in the family. The cancers in Mr. Buda and/or his family may be due to other genetic or environmental factors. There may be a gene mutation in one of these genes that current testing methods cannot detect, but that chance is small. There could be another gene that has not yet been discovered, or that we have not yet tested,  that is responsible for the cancer diagnoses in the family.   Therefore, it is important to remain in touch with cancer genetics in the future so that we can continue to offer Mr. Castille the most up to date genetic testing.   ADDITIONAL GENETIC TESTING:  Mr. Popelka genetic testing was fairly extensive.  If there are genes identified to increase cancer risk that can be analyzed in the future, we  would be happy to discuss and coordinate this testing at that time.    CANCER SCREENING RECOMMENDATIONS:  Mr. Wiechman test result is considered negative (normal).  This means that we have not identified a hereditary cause for his personal and family history of cancer at this time.   An individual's cancer risk and medical management are not determined by genetic test results alone. Overall cancer risk assessment incorporates additional factors, including personal medical history, family history, and any available genetic information that may result in a personalized plan for cancer prevention and surveillance. Therefore, it is recommended he continue to follow the cancer management and screening guidelines provided by his healthcare providers.  Based on the reported personal and family history, specific cancer screenings for Mr. EHAB HUMBER include:  Colon Cancer Screening: Due to Mr. Bathe's father's history of colon cancer, he is recommended to begin colonoscopies at age 40 and repeat every 5 years. More frequent colonoscopies may be recommended if polyps are identified.  RECOMMENDATIONS FOR FAMILY MEMBERS:   Since he did not inherit a mutation in a cancer predisposition gene included on this panel, his son could not have inherited a mutation from him in one of these genes. We do not recommend familial testing for the NF1 variant of uncertain significance (VUS).  FOLLOW-UP:  Cancer genetics is a rapidly advancing field and it is possible that new genetic tests will be appropriate for him and/or his family members in the future. We encourage him to remain in contact with cancer genetics on an annual basis so we can update his personal and family histories and let him know of advances in cancer genetics that may benefit this family.   Lalla Brothers, MS, [facility] Genetic Counselor [email or web address] (P) [phone]

## 2023-04-11 DIAGNOSIS — Z860100 Personal history of colon polyps, unspecified: Secondary | ICD-10-CM | POA: Diagnosis not present

## 2023-04-11 DIAGNOSIS — Z09 Encounter for follow-up examination after completed treatment for conditions other than malignant neoplasm: Secondary | ICD-10-CM | POA: Diagnosis not present

## 2023-04-11 DIAGNOSIS — Z800 Family history of malignant neoplasm of digestive organs: Secondary | ICD-10-CM | POA: Diagnosis not present

## 2023-04-11 DIAGNOSIS — K5730 Diverticulosis of large intestine without perforation or abscess without bleeding: Secondary | ICD-10-CM | POA: Diagnosis not present

## 2023-04-11 DIAGNOSIS — K648 Other hemorrhoids: Secondary | ICD-10-CM | POA: Diagnosis not present

## 2023-07-23 DIAGNOSIS — F410 Panic disorder [episodic paroxysmal anxiety] without agoraphobia: Secondary | ICD-10-CM | POA: Diagnosis not present

## 2023-07-23 DIAGNOSIS — F4010 Social phobia, unspecified: Secondary | ICD-10-CM | POA: Diagnosis not present

## 2023-09-06 DIAGNOSIS — R7989 Other specified abnormal findings of blood chemistry: Secondary | ICD-10-CM | POA: Diagnosis not present

## 2023-09-13 DIAGNOSIS — R82998 Other abnormal findings in urine: Secondary | ICD-10-CM | POA: Diagnosis not present

## 2023-09-13 DIAGNOSIS — Z79899 Other long term (current) drug therapy: Secondary | ICD-10-CM | POA: Diagnosis not present

## 2023-09-13 DIAGNOSIS — Z0000 Encounter for general adult medical examination without abnormal findings: Secondary | ICD-10-CM | POA: Diagnosis not present

## 2023-09-13 DIAGNOSIS — N401 Enlarged prostate with lower urinary tract symptoms: Secondary | ICD-10-CM | POA: Diagnosis not present

## 2023-09-13 DIAGNOSIS — D171 Benign lipomatous neoplasm of skin and subcutaneous tissue of trunk: Secondary | ICD-10-CM | POA: Diagnosis not present

## 2023-10-05 ENCOUNTER — Ambulatory Visit: Payer: Self-pay | Admitting: Surgery

## 2023-10-05 DIAGNOSIS — D171 Benign lipomatous neoplasm of skin and subcutaneous tissue of trunk: Secondary | ICD-10-CM | POA: Diagnosis not present

## 2023-10-05 NOTE — H&P (Signed)
 Subjective   Chief Complaint: New Consultation (removal of lipomas on back)     History of Present Illness: Adam Trevino is a 38 y.o. male who is seen today as an office consultation at the request of Dr. Waynard Edwards for evaluation of New Consultation (removal of lipomas on back) .   This is a 38 year old male referred by Dr. Sherrye Payor for 2 lipomas of the left upper back.  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  He has undergone extensive surgery for that.  He states that the mass on his back began about 3 years ago.  It has become noticeably larger and is causing some discomfort.  He has a smaller mass on his posterior left shoulder that is also enlarging and is causing some discomfort.  He presents now to discuss excision.  He is on no blood thinners.  Review of Systems: A complete review of systems was obtained from the patient.  I have reviewed this information and discussed as appropriate with the patient.  See HPI as well for other ROS.  Review of Systems  Constitutional: Negative.   HENT:  Positive for hearing loss.   Eyes: Negative.   Respiratory: Negative.    Cardiovascular: Negative.   Gastrointestinal: Negative.   Genitourinary: Negative.   Musculoskeletal: Negative.   Skin: Negative.   Neurological: Negative.   Endo/Heme/Allergies: Negative.   Psychiatric/Behavioral:  The patient is nervous/anxious.       Medical History: Past Medical History:  Diagnosis Date   Anxiety    History of cancer     Patient Active Problem List  Diagnosis   History of atypical mole   History of osteosarcoma   Lipoma of back   ADHD, hyperactive-impulsive type   Migraine without aura and without status migrainosus, not intractable   History of posttraumatic stress disorder (PTSD)   Osteosarcoma of femur (CMS/HHS-HCC)   Generalized anxiety disorder   Sleep apnea in adult    Past Surgical History:  Procedure Laterality Date   wisdom teeth   2003     No Known Allergies  Current Outpatient Medications on File Prior to Visit  Medication Sig Dispense Refill   ALPRAZolam (XANAX) 1 MG tablet      amitriptyline (ELAVIL) 50 MG tablet Take 50 mg by mouth at bedtime     l-methylfolate-b2-b6-b12 (CEREFOLIN) 11-28-48-1 mg Tab per tablet Take 1 tablet by mouth once daily     No current facility-administered medications on file prior to visit.    Family History  Problem Relation Age of Onset   Colon cancer Mother      Social History   Tobacco Use  Smoking Status Never  Smokeless Tobacco Never     Social History   Socioeconomic History   Marital status: Married  Occupational History   Occupation: Art gallery manager  Tobacco Use   Smoking status: Never   Smokeless tobacco: Never  Vaping Use   Vaping status: Never Used  Substance and Sexual Activity   Alcohol use: Not Currently    Alcohol/week: 6.7 standard drinks of alcohol    Types: 8 drink(s) per week   Drug use: Never   Social Drivers of Health   Food Insecurity: No Food Insecurity (08/24/2020)   Received from [facility]   Hunger Vital Sign    Worried About Running Out of Food in the Last Year: Never true    Ran Out of Food in the Last Year: Never true  Received from [facility]   Social Network  Housing Stability: Unknown (10/05/2023)   Housing Stability Vital Sign    Homeless in the Last Year: No    Objective:    Vitals:   10/05/23 1107  BP: 135/79  Pulse: 79  Temp: 36.9 C (98.5 F)  SpO2: 98%  Weight: 75.3 kg (166 lb)  Height: 172.7 cm (5\' 8" )  PainSc: 0-No pain  PainLoc: Back    Body mass index is 25.24 kg/m.  Physical Exam   Constitutional:  WDWN in NAD, conversant, no obvious deformities; lying in bed comfortably Eyes:  Pupils equal, round; sclera anicteric; moist conjunctiva; no lid lag HENT:  Oral mucosa moist; good dentition  Neck:  No masses palpated, trachea midline; no thyromegaly CV:  Regular rate and rhythm; no murmurs;  extremities well-perfused with no edema Abd:  +bowel sounds, soft, non-tender, no palpable organomegaly; no palpable hernias Musc: Normal gait; no apparent clubbing or cyanosis in extremities Left posterior shoulder there is a protruding 3 cm subcutaneous mass that is smooth, soft, and mobile.  No overlying skin changes Left upper back shows a larger 8 cm protruding subcutaneous mass.  This is also smooth, soft, and mobile.  No overlying skin changes Lymphatic:  No palpable cervical or axillary lymphadenopathy Skin:  Warm, dry; no sign of jaundice Psychiatric - alert and oriented x 4; calm mood and affect    Assessment and Plan:  Diagnoses and all orders for this visit:  Lipoma of back  The patient has 2 subcutaneous lipomas on the left upper back and posterior shoulder.  Recommend excision of subcutaneous lipomas of the left upper back and left posterior shoulder under anesthesia.The surgical procedure has been discussed with the patient.  Potential risks, benefits, alternative treatments, and expected outcomes have been explained.  All of the patient's questions at this time have been answered.  The likelihood of reaching the patient's treatment goal is good.  The patient understands the proposed surgical procedure and wishes to proceed.   Lissa Morales, MD  10/05/2023 2:06 PM

## 2023-10-24 DIAGNOSIS — R351 Nocturia: Secondary | ICD-10-CM | POA: Diagnosis not present

## 2023-10-24 DIAGNOSIS — N3281 Overactive bladder: Secondary | ICD-10-CM | POA: Diagnosis not present

## 2023-10-24 DIAGNOSIS — Z87442 Personal history of urinary calculi: Secondary | ICD-10-CM | POA: Diagnosis not present

## 2023-11-05 ENCOUNTER — Ambulatory Visit (HOSPITAL_COMMUNITY)
Admission: RE | Admit: 2023-11-05 | Discharge: 2023-11-05 | Disposition: A | Discharge status: Home / Self Care | Source: Ambulatory Visit | Attending: Cardiology | Admitting: Cardiology

## 2023-11-05 VITALS — BP 139/75 | HR 90 | Wt 164.0 lb

## 2023-11-05 DIAGNOSIS — E785 Hyperlipidemia, unspecified: Secondary | ICD-10-CM | POA: Insufficient documentation

## 2023-11-05 DIAGNOSIS — Z9221 Personal history of antineoplastic chemotherapy: Secondary | ICD-10-CM | POA: Diagnosis not present

## 2023-11-05 DIAGNOSIS — I427 Cardiomyopathy due to drug and external agent: Secondary | ICD-10-CM | POA: Diagnosis not present

## 2023-11-05 DIAGNOSIS — Z85830 Personal history of malignant neoplasm of bone: Secondary | ICD-10-CM | POA: Diagnosis not present

## 2023-11-05 DIAGNOSIS — R0789 Other chest pain: Secondary | ICD-10-CM | POA: Insufficient documentation

## 2023-11-05 LAB — LIPID PANEL
Cholesterol: 264 mg/dL — ABNORMAL HIGH (ref 0–200)
HDL: 60 mg/dL (ref >40)
LDL Cholesterol: 185 mg/dL — ABNORMAL HIGH (ref 0–99)
Total CHOL/HDL Ratio: 4.4 ratio
Triglycerides: 97 mg/dL (ref <150)
VLDL: 19 mg/dL (ref 0–40)

## 2023-11-05 NOTE — Patient Instructions (Signed)
 There has been no changes to your medications.  Labs done today, your results will be available in MyChart, we will contact you for abnormal readings.  Your physician has requested that you have an echocardiogram. Echocardiography is a painless test that uses sound waves to create images of your heart. It provides your doctor with information about the size and shape of your heart and how well your heart's chambers and valves are working. This procedure takes approximately one hour. There are no restrictions for this procedure. Please do NOT wear cologne, perfume, aftershave, or lotions (deodorant is allowed). Please arrive 15 minutes prior to your appointment time.  Please note: We ask at that you not bring children with you during ultrasound (echo/ vascular) testing. Due to room size and safety concerns, children are not allowed in the ultrasound rooms during exams. Our front office staff cannot provide observation of children in our lobby area while testing is being conducted. An adult accompanying a patient to their appointment will only be allowed in the ultrasound room at the discretion of the ultrasound technician under special circumstances. We apologize for any inconvenience.  Dr. Mitzie Anda has ordered a CT coronary calcium score.   Test locations:  [facility]  [facility]  This is $99 out of pocket.   Coronary CalciumScan A coronary calcium scan is an imaging test used to look for deposits of calcium and other fatty materials (plaques) in the inner lining of the blood vessels of the heart (coronary arteries). These deposits of calcium and plaques can partly clog and narrow the coronary arteries without producing any symptoms or warning signs. This puts a person at risk for a heart attack. This test can detect these deposits before symptoms develop. Tell a health care provider about: Any  allergies you have. All medicines you are taking, including vitamins, herbs, eye drops, creams, and over-the-counter medicines. Any problems you or family members have had with anesthetic medicines. Any blood disorders you have. Any surgeries you have had. Any medical conditions you have. Whether you are pregnant or may be pregnant. What are the risks? Generally, this is a safe procedure. However, problems may occur, including: Harm to a pregnant woman and her unborn baby. This test involves the use of radiation. Radiation exposure can be dangerous to a pregnant woman and her unborn baby. If you are pregnant, you generally should not have this procedure done. Slight increase in the risk of cancer. This is because of the radiation involved in the test. What happens before the procedure? No preparation is needed for this procedure. What happens during the procedure? You will undress and remove any jewelry around your neck or chest. You will put on a hospital gown. Sticky electrodes will be placed on your chest. The electrodes will be connected to an electrocardiogram (ECG) machine to record a tracing of the electrical activity of your heart. A CT scanner will take pictures of your heart. During this time, you will be asked to lie still and hold your breath for 2-3 seconds while a picture of your heart is being taken. The procedure may vary among health care providers and hospitals. What happens after the procedure? You can get dressed. You can return to your normal activities. It is up to you to get the results of your test. Ask your health care provider, or the department that is doing the test, when your results will be ready. Summary A  coronary calcium scan is an imaging test used to look for deposits of calcium and other fatty materials (plaques) in the inner lining of the blood vessels of the heart (coronary arteries). Generally, this is a safe procedure. Tell your health care provider if  you are pregnant or may be pregnant. No preparation is needed for this procedure. A CT scanner will take pictures of your heart. You can return to your normal activities after the scan is done. This information is not intended to replace advice given to you by your health care provider. Make sure you discuss any questions you have with your health care provider. Document Released: 12/09/2007 Document Revised: 05/01/2016 Document Reviewed: 05/01/2016 Elsevier Interactive Patient Education  2017 [employer].  Your physician recommends that you schedule a follow-up appointment in: 1 year. ( May 2026) ** PLEASE CALL THE OFFICE IN FEBRUARY 2026 TO ARRANGE YOUR FOLLOW UP APPOINTMENT.**  If you have any questions or concerns before your next appointment please send us  a message through [place] or call our office at [phone].    TO LEAVE A MESSAGE FOR THE NURSE SELECT OPTION 2, PLEASE LEAVE A MESSAGE INCLUDING: YOUR NAME DATE OF BIRTH CALL BACK NUMBER REASON FOR CALL**this is important as we prioritize the call backs  YOU WILL RECEIVE A CALL BACK THE SAME DAY AS LONG AS YOU CALL BEFORE 4:00 PM  At the [facility], you and your health needs are our priority. As part of our continuing mission to provide you with exceptional heart care, we have created designated Provider Care Teams. These Care Teams include your primary Cardiologist (physician) and Advanced Practice Providers (APPs- Physician Assistants and Nurse Practitioners) who all work together to provide you with the care you need, when you need it.   You may see any of the following providers on your designated Care Team at your next follow up: Dr Jules Oar Dr Peder Bourdon Dr. Alwin Baars Dr. Arta Lark Amy Marijane Shoulders, NP Ruddy Corral, [place] [facility] [place], [place] Dennise Fitz, NP [place] Lee, NP Shawnee Dellen, NP Luster Salters, PharmD Bevely Brush, PharmD   Please be sure to bring in all  your medications bottles to every appointment.    Thank you for choosing [facility]

## 2023-11-05 NOTE — Progress Notes (Addendum)
 PCP: Dr. Genelle Kennedy Cardiology: Dr. Mitzie Anda  Chief complaint: History of Adriamycin use  38 y.o. with history of osteosarcoma, PTSD, and hyperlipidemia was referred by Dr. Genelle Kennedy for cardiology evaluation given both history of Adriamycin use and CAD risk. Patient had treatment of left leg osteosarcoma at age 24.  He had chemotherapy including Adriamycin at that time.  He remembers no cardiac complications. He did have an echo in 7/17 with EF 50-55%, normal RV.  Patient does not smoke and has been trying to eat more healthily.  He had a grandfather who had his first MI in his 6s.  His father died young from lung cancer. Patient generally does well symptomatically.  He has some occasional sharp, atypical chest pain that can be on the right or left side.  It has no trigger (not exertional).  He has good exercise tolerance.  Not able to run due to his prior left operation.  However, he enjoys golf and hunting and has no limitation from exertional dyspnea or chest pain. No syncope/lightheadedness.  Working full time as VP of an Emergency planning/management officer.   ECG (personally reviewed): NSR, normal  Labs (1/24): LDL 126, TGs 78, creatinine 0.9  PMH: 1. Osteosarcoma: Treated at age 51 with surgery and chemotherapy including Adriamycin. - Echo (7/17): EF 50-55%, normal RV.  2. PTSD 3. ADD 4. Hyperlipidemia  FH: Grandfather with MI in his 6s.  Father died relatively young with lung cancer.   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   ROS: All systems reviewed and negative except as per HPI.   Current Outpatient Medications  Medication Sig Dispense Refill   ALPRAZolam (XANAX) 1 MG tablet Take by mouth.     famotidine (PEPCID) 20 MG tablet Take 20 mg by mouth daily.     alfuzosin (UROXATRAL) 10 MG 24 hr tablet Take 10 mg by mouth daily.     amitriptyline (ELAVIL) 50 MG tablet TAKE 1 TABLET(50 MG) BY MOUTH AT BEDTIME (Patient not taking: Reported on 11/05/2023)     finasteride  (PROSCAR) 5 MG tablet Take 5 mg by mouth daily. (Patient not taking: Reported on 11/05/2023)     Methylfol-Methylcob-Acetylcyst (CEREFOLIN NAC PO) Take 1 tablet by mouth daily. (Patient not taking: Reported on 11/05/2023)     Multiple Vitamin (MULTIVITAMIN) capsule Take 1 capsule by mouth daily. (Patient not taking: Reported on 11/05/2023)     SUMAtriptan (IMITREX) 100 MG tablet TAKE 1 TABLET BY MOUTH AT ONSET OF HEADACHE. MAY REPEAT IN 2 HOURS IF THE HEADACHE PAIN PERSISTS. NO MORE THAN 2 TABLETS IN A 24 HOUR PERIOD (Patient not taking: Reported on 11/05/2023)     terbinafine  (LAMISIL ) 250 MG tablet Take 1 tablet (250 mg total) by mouth daily. (Patient not taking: Reported on 11/05/2023) 90 tablet 0   No current facility-administered medications for this encounter.   BP 139/75   Pulse 90   Wt 74.4 kg (164 lb)   SpO2 99%   BMI 25.69 kg/m  General: NAD Neck: No JVD, no thyromegaly or thyroid  nodule.  Lungs: Clear to auscultation bilaterally with normal respiratory effort. CV: Nondisplaced PMI.  Heart regular S1/S2, no S3/S4, no murmur.  No peripheral edema.  No carotid bruit.  Normal pedal pulses.  Abdomen: Soft, nontender, no hepatosplenomegaly, no distention.  Skin: Intact without lesions or rashes.  Neurologic: Alert and oriented x 3.  Psych: Normal affect. Extremities: No clubbing or cyanosis.  HEENT: Normal.   Assessment/Plan: 1. Cardio-oncology: Prior Adriamycin use  with osteosarcoma treatment > 20 years ago.  Can present with late cardio-toxicity but would be unusual.  Echo in 5/17 with low normal function, EF 50-55%.   - I will obtain repeat echo.  2. CAD risk: Patient's grandfather had an MI in his 31s and died from a recurrent MI in his 21s.  Patient is a nonsmoker but has history of hyperlipidemia. No ischemic chest pain or dyspnea. He has some very atypical chest pain.  - I will check lipids and lipoprotein (a).  - I will arrange for coronary artery calcium score.   Followup in  1 year if the above studies are normal.   I spent 56 minutes reviewing records, interviewing/examining patient, and managing orders.   Adam Trevino 11/05/2023

## 2023-11-07 ENCOUNTER — Ambulatory Visit (HOSPITAL_COMMUNITY): Payer: Self-pay

## 2023-11-07 DIAGNOSIS — E7800 Pure hypercholesterolemia, unspecified: Secondary | ICD-10-CM

## 2023-11-07 LAB — LIPOPROTEIN A (LPA): Lipoprotein (a): 20 nmol/L (ref <75.0)

## 2023-11-07 MED ORDER — ROSUVASTATIN CALCIUM 10 MG PO TABS: 10.0000 mg | ORAL_TABLET | Freq: Every day | ORAL | 3 refills | Status: DC

## 2023-11-07 NOTE — Telephone Encounter (Signed)
Pt aware.

## 2023-11-08 ENCOUNTER — Other Ambulatory Visit (HOSPITAL_COMMUNITY): Payer: Self-pay

## 2023-12-07 DIAGNOSIS — Z87442 Personal history of urinary calculi: Secondary | ICD-10-CM | POA: Diagnosis not present

## 2023-12-07 DIAGNOSIS — N3281 Overactive bladder: Secondary | ICD-10-CM | POA: Diagnosis not present

## 2023-12-07 DIAGNOSIS — R3912 Poor urinary stream: Secondary | ICD-10-CM | POA: Diagnosis not present

## 2023-12-07 DIAGNOSIS — R351 Nocturia: Secondary | ICD-10-CM | POA: Diagnosis not present

## 2023-12-18 ENCOUNTER — Ambulatory Visit (HOSPITAL_COMMUNITY)
Admission: RE | Admit: 2023-12-18 | Discharge: 2023-12-18 | Disposition: A | Discharge status: Home / Self Care | Payer: Self-pay | Source: Ambulatory Visit | Attending: Internal Medicine | Admitting: Internal Medicine

## 2023-12-18 DIAGNOSIS — Z85830 Personal history of malignant neoplasm of bone: Secondary | ICD-10-CM | POA: Insufficient documentation

## 2023-12-18 DIAGNOSIS — Z136 Encounter for screening for cardiovascular disorders: Secondary | ICD-10-CM | POA: Insufficient documentation

## 2024-01-02 ENCOUNTER — Other Ambulatory Visit (HOSPITAL_COMMUNITY)

## 2024-01-07 DIAGNOSIS — G43909 Migraine, unspecified, not intractable, without status migrainosus: Secondary | ICD-10-CM | POA: Diagnosis not present

## 2024-01-16 ENCOUNTER — Ambulatory Visit (HOSPITAL_COMMUNITY)
Admission: RE | Admit: 2024-01-16 | Discharge: 2024-01-16 | Disposition: A | Discharge status: Home / Self Care | Source: Ambulatory Visit | Attending: Cardiology

## 2024-01-16 ENCOUNTER — Other Ambulatory Visit (HOSPITAL_COMMUNITY)

## 2024-01-16 ENCOUNTER — Ambulatory Visit (HOSPITAL_COMMUNITY)
Admission: RE | Admit: 2024-01-16 | Discharge: 2024-01-16 | Discharge status: Home / Self Care | Source: Ambulatory Visit | Attending: Cardiology | Admitting: Cardiology

## 2024-01-16 ENCOUNTER — Ambulatory Visit (HOSPITAL_COMMUNITY): Payer: Self-pay | Admitting: Cardiology

## 2024-01-16 DIAGNOSIS — I509 Heart failure, unspecified: Secondary | ICD-10-CM | POA: Diagnosis not present

## 2024-01-16 DIAGNOSIS — Z85830 Personal history of malignant neoplasm of bone: Secondary | ICD-10-CM

## 2024-01-16 DIAGNOSIS — I5040 Unspecified combined systolic (congestive) and diastolic (congestive) heart failure: Secondary | ICD-10-CM | POA: Insufficient documentation

## 2024-01-16 DIAGNOSIS — E7800 Pure hypercholesterolemia, unspecified: Secondary | ICD-10-CM | POA: Diagnosis not present

## 2024-01-16 LAB — ECHOCARDIOGRAM COMPLETE
Area-P 1/2: 3.28 cm2
Calc EF: 50.1 %
S' Lateral: 3.2 cm
Single Plane A2C EF: 50.3 %
Single Plane A4C EF: 55 %

## 2024-01-16 LAB — LIPID PANEL
Cholesterol: 249 mg/dL — ABNORMAL HIGH (ref 0–200)
HDL: 76 mg/dL (ref >40)
LDL Cholesterol: 164 mg/dL — ABNORMAL HIGH (ref 0–99)
Total CHOL/HDL Ratio: 3.3 ratio
Triglycerides: 46 mg/dL (ref <150)
VLDL: 9 mg/dL (ref 0–40)

## 2024-01-16 LAB — HEPATIC FUNCTION PANEL
ALT: 19 U/L (ref 0–44)
AST: 20 U/L (ref 15–41)
Albumin: 4.1 g/dL (ref 3.5–5.0)
Alkaline Phosphatase: 59 U/L (ref 38–126)
Bilirubin, Direct: 0.2 mg/dL (ref 0.0–0.2)
Indirect Bilirubin: 1.3 mg/dL — ABNORMAL HIGH (ref 0.3–0.9)
Total Bilirubin: 1.5 mg/dL — ABNORMAL HIGH (ref 0.0–1.2)
Total Protein: 7.1 g/dL (ref 6.5–8.1)

## 2024-01-16 NOTE — Progress Notes (Signed)
  Echocardiogram 2D Echocardiogram has been performed.  Adam Trevino 01/16/2024, 1:39 PM

## 2024-02-14 DIAGNOSIS — R3912 Poor urinary stream: Secondary | ICD-10-CM | POA: Diagnosis not present

## 2024-02-14 DIAGNOSIS — R351 Nocturia: Secondary | ICD-10-CM | POA: Diagnosis not present

## 2024-02-14 DIAGNOSIS — N3281 Overactive bladder: Secondary | ICD-10-CM | POA: Diagnosis not present

## 2024-02-14 DIAGNOSIS — R350 Frequency of micturition: Secondary | ICD-10-CM | POA: Diagnosis not present

## 2024-03-05 DIAGNOSIS — F4010 Social phobia, unspecified: Secondary | ICD-10-CM | POA: Diagnosis not present

## 2024-03-05 DIAGNOSIS — F410 Panic disorder [episodic paroxysmal anxiety] without agoraphobia: Secondary | ICD-10-CM | POA: Diagnosis not present

## 2024-04-16 DIAGNOSIS — D2261 Melanocytic nevi of right upper limb, including shoulder: Secondary | ICD-10-CM | POA: Diagnosis not present

## 2024-04-16 DIAGNOSIS — D1722 Benign lipomatous neoplasm of skin and subcutaneous tissue of left arm: Secondary | ICD-10-CM | POA: Diagnosis not present

## 2024-04-16 DIAGNOSIS — D225 Melanocytic nevi of trunk: Secondary | ICD-10-CM | POA: Diagnosis not present

## 2024-04-16 DIAGNOSIS — D2262 Melanocytic nevi of left upper limb, including shoulder: Secondary | ICD-10-CM | POA: Diagnosis not present

## 2024-04-24 ENCOUNTER — Institutional Professional Consult (permissible substitution): Admitting: Plastic Surgery

## 2024-04-24 DIAGNOSIS — R351 Nocturia: Secondary | ICD-10-CM | POA: Diagnosis not present

## 2024-04-24 DIAGNOSIS — R350 Frequency of micturition: Secondary | ICD-10-CM | POA: Diagnosis not present

## 2024-04-24 DIAGNOSIS — R3915 Urgency of urination: Secondary | ICD-10-CM | POA: Diagnosis not present

## 2024-05-26 ENCOUNTER — Ambulatory Visit: Admitting: Plastic Surgery

## 2024-05-26 ENCOUNTER — Encounter: Payer: Self-pay | Admitting: Plastic Surgery

## 2024-05-26 VITALS — BP 135/76 | HR 67 | Ht 68.0 in | Wt 156.6 lb

## 2024-05-26 DIAGNOSIS — R222 Localized swelling, mass and lump, trunk: Secondary | ICD-10-CM

## 2024-05-26 DIAGNOSIS — D489 Neoplasm of uncertain behavior, unspecified: Secondary | ICD-10-CM

## 2024-05-26 DIAGNOSIS — Z85830 Personal history of malignant neoplasm of bone: Secondary | ICD-10-CM

## 2024-05-26 NOTE — Progress Notes (Signed)
 Referring Provider Adam Anes, MD [address] [place],  [place] [address]   CC:  Chief Complaint  Patient presents with   consult      Adam Trevino is an 38 y.o. male.  HPI: Adam Trevino is a 38 year old male who presents today with 2 masses 1 on the central upper portion of his back and 1 on his left shoulder that have been present for 2 years.  He states that the masses have been growing slowly and his wife was recently concerned with how large they had become.  He denies any pain.  He would like to have them removed.  Of note the patient has a history of osteosarcoma that was treated when he was a child.  He has had no evidence of recurrence.  He has had a recent cardiac workup which was unremarkable.  He has no history of blood clots.  He has normal exercise tolerance.  He has no allergies and is not taking any blood thinners.  Allergies  Allergen Reactions   Alprazolam     Other Reaction(s): became addicted and had to do [facility] in 2014 or so. it had snow balled out of control    Outpatient Encounter Medications as of 05/26/2024  Medication Sig   alfuzosin (UROXATRAL) 10 MG 24 hr tablet Take 10 mg by mouth daily.   ALPRAZolam (XANAX) 1 MG tablet Take by mouth.   famotidine (PEPCID) 20 MG tablet Take 20 mg by mouth daily.   amitriptyline (ELAVIL) 50 MG tablet TAKE 1 TABLET(50 MG) BY MOUTH AT BEDTIME (Patient not taking: Reported on 11/05/2023)   finasteride (PROSCAR) 5 MG tablet Take 5 mg by mouth daily. (Patient not taking: Reported on 11/05/2023)   Methylfol-Methylcob-Acetylcyst (CEREFOLIN NAC PO) Take 1 tablet by mouth daily. (Patient not taking: Reported on 11/05/2023)   Multiple Vitamin (MULTIVITAMIN) capsule Take 1 capsule by mouth daily. (Patient not taking: Reported on 11/05/2023)   rosuvastatin  (CRESTOR ) 10 MG tablet Take 1 tablet (10 mg total) by mouth daily.   SUMAtriptan (IMITREX) 100 MG tablet TAKE 1 TABLET BY MOUTH AT ONSET OF HEADACHE. MAY REPEAT  IN 2 HOURS IF THE HEADACHE PAIN PERSISTS. NO MORE THAN 2 TABLETS IN A 24 HOUR PERIOD (Patient not taking: Reported on 05/26/2024)   terbinafine  (LAMISIL ) 250 MG tablet Take 1 tablet (250 mg total) by mouth daily. (Patient not taking: Reported on 05/26/2024)   No facility-administered encounter medications on file as of 05/26/2024.     Past Medical History:  Diagnosis Date   ADD (attention deficit disorder)    Anxiety    Osteogenic sarcoma (HCC)    in L thigh age 17.   Snoring     Past Surgical History:  Procedure Laterality Date   cadaver bone     surgery for left thigh cancer   osteosarcoma Left    WISDOM TOOTH EXTRACTION      Family History  Problem Relation Age of Onset   Colon cancer Mother 25   Lung cancer Father 67   Throat cancer Maternal Grandmother 4 - 85       smoked   Prostate cancer Maternal Grandfather 5   Heart attack Paternal Grandfather     Social History   Social History Narrative   Not on file     Review of Systems General: Denies fevers, chills, weight loss CV: Denies chest pain, shortness of breath, palpitations Skin: Large subcutaneous mass on the upper portion of the back of  the left shoulder.  Physical Exam    05/26/2024    9:13 AM 11/05/2023    2:05 PM 06/07/2021    8:47 AM  Vitals with BMI  Height 5' 8  5' 7  Weight 156 lbs 10 oz 164 lbs 175 lbs 8 oz  BMI 23.82  27.48  Systolic 135 139 850  Diastolic 76 75 88  Pulse 67 90 93    General:  No acute distress,  Alert and oriented, Non-Toxic, Normal speech and affect Skin: As noted patient has 2 large subcutaneous masses 1 on the upper back which is approximately 5 cm in diameter and 1 on the shoulder which is approximately 3 cm in diameter.  They are soft and nontender they are mobile.  Assessment/Plan Subcutaneous masses: These are most likely lipomas but given the patient's history of osteosarcoma and chemotherapy along with the size and recent growth of the masses I believe they  should be removed for pathologic diagnosis.  We discussed the location of the incisions and the risk of seroma postoperatively.  He understands that I may use a drain especially on the lipoma on the back.  There is a risk of recurrence and he may need additional procedures based on pathology.  All questions were answered to his satisfaction.  Photographs were obtained today with his consent.  Will submit him for removal of the upper back and left shoulder masses at his request.  Adam Trevino 05/26/2024, 9:50 AM

## 2024-05-28 ENCOUNTER — Ambulatory Visit: Admitting: Physician Assistant

## 2024-05-28 VITALS — BP 118/68 | HR 74 | Wt 153.8 lb

## 2024-05-28 DIAGNOSIS — R222 Localized swelling, mass and lump, trunk: Secondary | ICD-10-CM | POA: Diagnosis not present

## 2024-05-28 DIAGNOSIS — R350 Frequency of micturition: Secondary | ICD-10-CM | POA: Diagnosis not present

## 2024-05-28 DIAGNOSIS — M6281 Muscle weakness (generalized): Secondary | ICD-10-CM | POA: Diagnosis not present

## 2024-05-28 DIAGNOSIS — R351 Nocturia: Secondary | ICD-10-CM | POA: Diagnosis not present

## 2024-05-28 DIAGNOSIS — D489 Neoplasm of uncertain behavior, unspecified: Secondary | ICD-10-CM

## 2024-05-28 DIAGNOSIS — R3915 Urgency of urination: Secondary | ICD-10-CM | POA: Diagnosis not present

## 2024-05-28 MED ORDER — OXYCODONE HCL 5 MG PO TABS: 5.0000 mg | ORAL_TABLET | Freq: Three times a day (TID) | ORAL | 0 refills | Status: AC | PRN

## 2024-05-28 MED ORDER — ONDANSETRON 4 MG PO TBDP: 4.0000 mg | ORAL_TABLET | Freq: Three times a day (TID) | ORAL | 0 refills | Status: AC | PRN

## 2024-05-28 NOTE — H&P (View-Only) (Signed)
 Patient ID: Adam Trevino, male    DOB: Oct 13, 1985, 38 y.o.   MRN: [id number]  Chief Complaint  Patient presents with   Pre-op Exam      ICD-10-CM   1. Neoplasm, uncertain whether benign or malignant  D48.9        History of Present Illness: Adam Trevino is a 38 y.o.  male  with a history of multiple subcutaneous masses.  He presents for preoperative evaluation for upcoming procedure, excision of masses from upper back and left shoulder, scheduled for 06/10/2024 with Dr. Waddell.  The patient has not had problems with anesthesia.  Previous surgeries for his osteosarcoma without any complication from anesthesia.  He denies any personal or family history of blood clots or clot disorder.  Denies any personal history of severe cardiac or pulmonary disease, varicosities, or nicotine use disorder.  He has a history of osteosarcoma from when he was approximately 38 years old, but no known recurrence or issues since.  Summary of Previous Visit: Patient met with Dr. Waddell for initial consult 05/26/2024.  Discussed excision in the OR given the size.  Suspect they are lipomas, but given history of osteosarcoma and chemotherapy leave it they should be removed and sent to pathology for definitive diagnosis.  He understands that drain may to be placed given the suspected sizes of the defects.  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  PMH Significant for: Suspected lipomas, osteosarcoma (femur), PTSD.  His previously diagnosed migraine disorder is likely just related to allergies per patient.  OSA listed, but no CPAP or formally diagnosed OSA.   Past Medical History: Allergies: Allergies  Allergen Reactions   Alprazolam     Other Reaction(s): became addicted and had to [facility] or so. it had snow balled out of control    Current Medications:  Current Outpatient Medications:    ALPRAZolam (XANAX) 1 MG tablet, Take by mouth., Disp: ,  Rfl:    famotidine (PEPCID) 20 MG tablet, Take 20 mg by mouth daily., Disp: , Rfl:    ondansetron  (ZOFRAN -ODT) 4 MG disintegrating tablet, Take 1 tablet (4 mg total) by mouth every 8 (eight) hours as needed for nausea or vomiting., Disp: 20 tablet, Rfl: 0   oxyCODONE  (ROXICODONE ) 5 MG immediate release tablet, Take 1 tablet (5 mg total) by mouth every 8 (eight) hours as needed for up to 4 days for severe pain (pain score 7-10)., Disp: 6 tablet, Rfl: 0   SUMAtriptan (IMITREX) 100 MG tablet, TAKE 1 TABLET BY MOUTH AT ONSET OF HEADACHE. MAY REPEAT IN 2 HOURS IF THE HEADACHE PAIN PERSISTS. NO MORE THAN 2 TABLETS IN A 24 HOUR PERIOD, Disp: , Rfl:    alfuzosin (UROXATRAL) 10 MG 24 hr tablet, Take 10 mg by mouth daily. (Patient not taking: Reported on 05/28/2024), Disp: , Rfl:    amitriptyline (ELAVIL) 50 MG tablet, TAKE 1 TABLET(50 MG) BY MOUTH AT BEDTIME (Patient not taking: Reported on 05/28/2024), Disp: , Rfl:    finasteride (PROSCAR) 5 MG tablet, Take 5 mg by mouth daily. (Patient not taking: Reported on 05/28/2024), Disp: , Rfl:    Methylfol-Methylcob-Acetylcyst (CEREFOLIN NAC PO), Take 1 tablet by mouth daily. (Patient not taking: Reported on 05/28/2024), Disp: , Rfl:    Multiple Vitamin (MULTIVITAMIN) capsule, Take 1 capsule by mouth daily. (Patient not taking: Reported on 05/28/2024), Disp: , Rfl:    rosuvastatin  (CRESTOR ) 10 MG tablet, Take 1 tablet (  10 mg total) by mouth daily. (Patient not taking: Reported on 05/28/2024), Disp: 90 tablet, Rfl: 3   terbinafine  (LAMISIL ) 250 MG tablet, Take 1 tablet (250 mg total) by mouth daily. (Patient not taking: Reported on 05/28/2024), Disp: 90 tablet, Rfl: 0  Past Medical Problems: Past Medical History:  Diagnosis Date   ADD (attention deficit disorder)    Anxiety    Osteogenic sarcoma (HCC)    in L thigh age 56.   Snoring     Past Surgical History: Past Surgical History:  Procedure Laterality Date   cadaver bone     surgery for left thigh cancer    osteosarcoma Left    WISDOM TOOTH EXTRACTION      Social History: Social History   Socioeconomic History   Marital status: Single    Spouse name: Not on file   Number of children: Not on file   Years of education: Not on file   Highest education level: Not on file  Occupational History   Not on file  Tobacco Use   Smoking status: Never   Smokeless tobacco: Never   Tobacco comments:    smokes marijuana  Substance and Sexual Activity   Alcohol  use: Yes    Comment: socially   Drug use: Yes    Types: Marijuana   Sexual activity: Yes    Partners: Female  Other Topics Concern   Not on file  Social History Narrative   Not on file   Social Drivers of Health   Financial Resource Strain: Not on file  Food Insecurity: No Food Insecurity (08/24/2020)   Received from [facility]   Hunger Vital Sign    Within the past 12 months, you worried that your food would run out before you got the money to buy more.: Never true    Within the past 12 months, the food you bought just didn't last and you didn't have money to get more.: Never true  Transportation Needs: Not on file  Physical Activity: Not on file  Stress: Not on file  Social Connections: Unknown (11/07/2021)   Received from [facility]   Social Network    Social Network: Not on file  Intimate Partner Violence: Unknown (09/29/2021)   Received from [facility]   HITS    Physically Hurt: Not on file    Insult or Talk Down To: Not on file    Threaten Physical Harm: Not on file    Scream or Curse: Not on file    Family History: Family History  Problem Relation Age of Onset   Colon cancer Mother 56   Lung cancer Father 3   Throat cancer Maternal Grandmother 73 - 85       smoked   Prostate cancer Maternal Grandfather 94   Heart attack Paternal Grandfather     Review of Systems: ROS Denies any recent illness or infection  Physical Exam: Vital Signs BP 118/68 (BP Location: Left Arm, Patient Position: Sitting,  Cuff Size: Normal)   Pulse 74   Wt 153 lb 12.8 oz (69.8 kg)   SpO2 98%   BMI 23.39 kg/m   Physical Exam Constitutional:      General: Not in acute distress.    Appearance: Normal appearance. Not ill-appearing.  HENT:     Head: Normocephalic and atraumatic.  Eyes:     Pupils: Pupils are equal, round. Cardiovascular:     Rate and Rhythm: Normal rate.    Pulses: Normal pulses.  Pulmonary:  Effort: No respiratory distress or increased work of breathing.  Speaks in full sentences. Abdominal:     General: Abdomen is flat. No distension.   Musculoskeletal: Normal range of motion. No lower extremity swelling or edema. No varicosities. Skin:    General: Skin is warm and dry.     Findings: No erythema or rash.  Neurological:     Mental Status: Alert and oriented to person, place, and time.  Psychiatric:        Mood and Affect: Mood normal.        Behavior: Behavior normal.    Assessment/Plan: The patient is scheduled for excision of mass from upper back and left shoulder with Dr. Waddell.  Risks, benefits, and alternatives of procedure discussed, questions answered and consent obtained.    Smoking Status: Non-smoker.  Caprini Score: 5; Risk Factors include: Age, history of cancer (osteosarcoma), and length of planned surgery. Recommendation for mechanical prophylaxis. Encourage early ambulation.   Pictures obtained: 05/26/2024.  Post-op Rx sent to pharmacy: Oxycodone , Zofran .  Patient was provided with the General Surgical Risk consent document and Pain Medication Agreement prior to their appointment.  They had adequate time to read through the risk consent documents and Pain Medication Agreement. We also discussed them in person together during this preop appointment. All of their questions were answered to their satisfaction.  Recommended calling if they have any further questions.  Risk consent form and Pain Medication Agreement to be scanned into patient's  chart.    Electronically signed by: Honora Seip, PA-C 05/28/2024 3:20 PM

## 2024-05-28 NOTE — Progress Notes (Signed)
 Patient ID: Adam Trevino, male    DOB: Oct 13, 1985, 38 y.o.   MRN: [id number]  Chief Complaint  Patient presents with   Pre-op Exam      ICD-10-CM   1. Neoplasm, uncertain whether benign or malignant  D48.9        History of Present Illness: Adam Trevino is a 38 y.o.  male  with a history of multiple subcutaneous masses.  He presents for preoperative evaluation for upcoming procedure, excision of masses from upper back and left shoulder, scheduled for 06/10/2024 with Dr. Waddell.  The patient has not had problems with anesthesia.  Previous surgeries for his osteosarcoma without any complication from anesthesia.  He denies any personal or family history of blood clots or clot disorder.  Denies any personal history of severe cardiac or pulmonary disease, varicosities, or nicotine use disorder.  He has a history of osteosarcoma from when he was approximately 38 years old, but no known recurrence or issues since.  Summary of Previous Visit: Patient met with Dr. Waddell for initial consult 05/26/2024.  Discussed excision in the OR given the size.  Suspect they are lipomas, but given history of osteosarcoma and chemotherapy leave it they should be removed and sent to pathology for definitive diagnosis.  He understands that drain may to be placed given the suspected sizes of the defects.  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  PMH Significant for: Suspected lipomas, osteosarcoma (femur), PTSD.  His previously diagnosed migraine disorder is likely just related to allergies per patient.  OSA listed, but no CPAP or formally diagnosed OSA.   Past Medical History: Allergies: Allergies  Allergen Reactions   Alprazolam     Other Reaction(s): became addicted and had to do [facility] in 2014 or so. it had snow balled out of control    Current Medications:  Current Outpatient Medications:    ALPRAZolam (XANAX) 1 MG tablet, Take by mouth., Disp: ,  Rfl:    famotidine (PEPCID) 20 MG tablet, Take 20 mg by mouth daily., Disp: , Rfl:    ondansetron  (ZOFRAN -ODT) 4 MG disintegrating tablet, Take 1 tablet (4 mg total) by mouth every 8 (eight) hours as needed for nausea or vomiting., Disp: 20 tablet, Rfl: 0   oxyCODONE  (ROXICODONE ) 5 MG immediate release tablet, Take 1 tablet (5 mg total) by mouth every 8 (eight) hours as needed for up to 4 days for severe pain (pain score 7-10)., Disp: 6 tablet, Rfl: 0   SUMAtriptan (IMITREX) 100 MG tablet, TAKE 1 TABLET BY MOUTH AT ONSET OF HEADACHE. MAY REPEAT IN 2 HOURS IF THE HEADACHE PAIN PERSISTS. NO MORE THAN 2 TABLETS IN A 24 HOUR PERIOD, Disp: , Rfl:    alfuzosin (UROXATRAL) 10 MG 24 hr tablet, Take 10 mg by mouth daily. (Patient not taking: Reported on 05/28/2024), Disp: , Rfl:    amitriptyline (ELAVIL) 50 MG tablet, TAKE 1 TABLET(50 MG) BY MOUTH AT BEDTIME (Patient not taking: Reported on 05/28/2024), Disp: , Rfl:    finasteride (PROSCAR) 5 MG tablet, Take 5 mg by mouth daily. (Patient not taking: Reported on 05/28/2024), Disp: , Rfl:    Methylfol-Methylcob-Acetylcyst (CEREFOLIN NAC PO), Take 1 tablet by mouth daily. (Patient not taking: Reported on 05/28/2024), Disp: , Rfl:    Multiple Vitamin (MULTIVITAMIN) capsule, Take 1 capsule by mouth daily. (Patient not taking: Reported on 05/28/2024), Disp: , Rfl:    rosuvastatin  (CRESTOR ) 10 MG tablet, Take 1 tablet (  10 mg total) by mouth daily. (Patient not taking: Reported on 05/28/2024), Disp: 90 tablet, Rfl: 3   terbinafine  (LAMISIL ) 250 MG tablet, Take 1 tablet (250 mg total) by mouth daily. (Patient not taking: Reported on 05/28/2024), Disp: 90 tablet, Rfl: 0  Past Medical Problems: Past Medical History:  Diagnosis Date   ADD (attention deficit disorder)    Anxiety    Osteogenic sarcoma (HCC)    in L thigh age 56.   Snoring     Past Surgical History: Past Surgical History:  Procedure Laterality Date   cadaver bone     surgery for left thigh cancer    osteosarcoma Left    WISDOM TOOTH EXTRACTION      Social History: Social History   Socioeconomic History   Marital status: Single    Spouse name: Not on file   Number of children: Not on file   Years of education: Not on file   Highest education level: Not on file  Occupational History   Not on file  Tobacco Use   Smoking status: Never   Smokeless tobacco: Never   Tobacco comments:    smokes marijuana  Substance and Sexual Activity   Alcohol  use: Yes    Comment: socially   Drug use: Yes    Types: Marijuana   Sexual activity: Yes    Partners: Female  Other Topics Concern   Not on file  Social History Narrative   Not on file   Social Drivers of Health   Financial Resource Strain: Not on file  Food Insecurity: No Food Insecurity (08/24/2020)   Received from [facility]   Hunger Vital Sign    Within the past 12 months, you worried that your food would run out before you got the money to buy more.: Never true    Within the past 12 months, the food you bought just didn't last and you didn't have money to get more.: Never true  Transportation Needs: Not on file  Physical Activity: Not on file  Stress: Not on file  Social Connections: Unknown (11/07/2021)   Received from [facility]   Social Network    Social Network: Not on file  Intimate Partner Violence: Unknown (09/29/2021)   Received from [facility]   HITS    Physically Hurt: Not on file    Insult or Talk Down To: Not on file    Threaten Physical Harm: Not on file    Scream or Curse: Not on file    Family History: Family History  Problem Relation Age of Onset   Colon cancer Mother 56   Lung cancer Father 3   Throat cancer Maternal Grandmother 73 - 85       smoked   Prostate cancer Maternal Grandfather 94   Heart attack Paternal Grandfather     Review of Systems: ROS Denies any recent illness or infection  Physical Exam: Vital Signs BP 118/68 (BP Location: Left Arm, Patient Position: Sitting,  Cuff Size: Normal)   Pulse 74   Wt 153 lb 12.8 oz (69.8 kg)   SpO2 98%   BMI 23.39 kg/m   Physical Exam Constitutional:      General: Not in acute distress.    Appearance: Normal appearance. Not ill-appearing.  HENT:     Head: Normocephalic and atraumatic.  Eyes:     Pupils: Pupils are equal, round. Cardiovascular:     Rate and Rhythm: Normal rate.    Pulses: Normal pulses.  Pulmonary:  Effort: No respiratory distress or increased work of breathing.  Speaks in full sentences. Abdominal:     General: Abdomen is flat. No distension.   Musculoskeletal: Normal range of motion. No lower extremity swelling or edema. No varicosities. Skin:    General: Skin is warm and dry.     Findings: No erythema or rash.  Neurological:     Mental Status: Alert and oriented to person, place, and time.  Psychiatric:        Mood and Affect: Mood normal.        Behavior: Behavior normal.    Assessment/Plan: The patient is scheduled for excision of mass from upper back and left shoulder with Dr. Waddell.  Risks, benefits, and alternatives of procedure discussed, questions answered and consent obtained.    Smoking Status: Non-smoker.  Caprini Score: 5; Risk Factors include: Age, history of cancer (osteosarcoma), and length of planned surgery. Recommendation for mechanical prophylaxis. Encourage early ambulation.   Pictures obtained: 05/26/2024.  Post-op Rx sent to pharmacy: Oxycodone , Zofran .  Patient was provided with the General Surgical Risk consent document and Pain Medication Agreement prior to their appointment.  They had adequate time to read through the risk consent documents and Pain Medication Agreement. We also discussed them in person together during this preop appointment. All of their questions were answered to their satisfaction.  Recommended calling if they have any further questions.  Risk consent form and Pain Medication Agreement to be scanned into patient's  chart.    Electronically signed by: Honora Seip, PA-C 05/28/2024 3:20 PM

## 2024-06-04 ENCOUNTER — Encounter (HOSPITAL_BASED_OUTPATIENT_CLINIC_OR_DEPARTMENT_OTHER): Payer: Self-pay | Admitting: Plastic Surgery

## 2024-06-10 ENCOUNTER — Encounter (HOSPITAL_BASED_OUTPATIENT_CLINIC_OR_DEPARTMENT_OTHER): Admission: RE | Disposition: A | Payer: Self-pay | Attending: Plastic Surgery

## 2024-06-10 ENCOUNTER — Other Ambulatory Visit: Payer: Self-pay

## 2024-06-10 ENCOUNTER — Encounter (HOSPITAL_BASED_OUTPATIENT_CLINIC_OR_DEPARTMENT_OTHER): Payer: Self-pay | Admitting: Plastic Surgery

## 2024-06-10 ENCOUNTER — Ambulatory Visit (HOSPITAL_BASED_OUTPATIENT_CLINIC_OR_DEPARTMENT_OTHER): Admitting: Certified Registered"

## 2024-06-10 ENCOUNTER — Ambulatory Visit (HOSPITAL_BASED_OUTPATIENT_CLINIC_OR_DEPARTMENT_OTHER)
Admission: RE | Admit: 2024-06-10 | Discharge: 2024-06-10 | Disposition: A | Discharge status: Home / Self Care | Attending: Plastic Surgery | Admitting: Plastic Surgery

## 2024-06-10 DIAGNOSIS — D4819 Other specified neoplasm of uncertain behavior of connective and other soft tissue: Secondary | ICD-10-CM | POA: Diagnosis not present

## 2024-06-10 DIAGNOSIS — Z9221 Personal history of antineoplastic chemotherapy: Secondary | ICD-10-CM | POA: Diagnosis not present

## 2024-06-10 DIAGNOSIS — F419 Anxiety disorder, unspecified: Secondary | ICD-10-CM | POA: Insufficient documentation

## 2024-06-10 DIAGNOSIS — Z01818 Encounter for other preprocedural examination: Secondary | ICD-10-CM

## 2024-06-10 DIAGNOSIS — Z85830 Personal history of malignant neoplasm of bone: Secondary | ICD-10-CM | POA: Diagnosis not present

## 2024-06-10 DIAGNOSIS — D489 Neoplasm of uncertain behavior, unspecified: Secondary | ICD-10-CM | POA: Diagnosis present

## 2024-06-10 DIAGNOSIS — D171 Benign lipomatous neoplasm of skin and subcutaneous tissue of trunk: Secondary | ICD-10-CM | POA: Insufficient documentation

## 2024-06-10 HISTORY — PX: EXCISION MASS ABDOMINAL: SHX6701

## 2024-06-10 MED ORDER — DEXAMETHASONE SOD PHOSPHATE PF 10 MG/ML IJ SOLN
INTRAMUSCULAR | Status: DC | PRN
  Administered 2024-06-10: 12:00:00 4 mg via INTRAVENOUS

## 2024-06-10 MED ORDER — LIDOCAINE 2% (20 MG/ML) 5 ML SYRINGE
INTRAMUSCULAR | Status: AC
  Filled 2024-06-10: qty 5

## 2024-06-10 MED ORDER — 0.9 % SODIUM CHLORIDE (POUR BTL) OPTIME
TOPICAL | Status: DC | PRN
  Administered 2024-06-10: 11:00:00 1000 mL

## 2024-06-10 MED ORDER — PROPOFOL 500 MG/50ML IV EMUL
INTRAVENOUS | Status: AC
  Filled 2024-06-10: qty 100

## 2024-06-10 MED ORDER — CEFAZOLIN SODIUM-DEXTROSE 2-4 GM/100ML-% IV SOLN
2.0000 g | INTRAVENOUS | Status: AC
  Administered 2024-06-10: 12:00:00 2 g via INTRAVENOUS

## 2024-06-10 MED ORDER — MIDAZOLAM HCL 2 MG/2ML IJ SOLN
INTRAMUSCULAR | Status: AC
  Filled 2024-06-10: qty 2

## 2024-06-10 MED ORDER — SUGAMMADEX SODIUM 200 MG/2ML IV SOLN
INTRAVENOUS | Status: DC | PRN
  Administered 2024-06-10: 13:00:00 200 mg via INTRAVENOUS

## 2024-06-10 MED ORDER — OXYCODONE HCL 5 MG PO TABS
5.0000 mg | ORAL_TABLET | Freq: Once | ORAL | Status: AC | PRN
  Administered 2024-06-10: 15:00:00 5 mg via ORAL

## 2024-06-10 MED ORDER — ROCURONIUM BROMIDE 10 MG/ML (PF) SYRINGE
PREFILLED_SYRINGE | INTRAVENOUS | Status: DC | PRN
  Administered 2024-06-10: 12:00:00 50 mg via INTRAVENOUS
  Administered 2024-06-10: 12:00:00 10 mg via INTRAVENOUS

## 2024-06-10 MED ORDER — CELECOXIB 200 MG PO CAPS
200.0000 mg | ORAL_CAPSULE | Freq: Once | ORAL | Status: AC
  Administered 2024-06-10: 10:00:00 200 mg via ORAL

## 2024-06-10 MED ORDER — ACETAMINOPHEN 500 MG PO TABS
1000.0000 mg | ORAL_TABLET | Freq: Once | ORAL | Status: AC
  Administered 2024-06-10: 10:00:00 1000 mg via ORAL

## 2024-06-10 MED ORDER — DROPERIDOL 2.5 MG/ML IJ SOLN: 0.6250 mg | Freq: Once | INTRAMUSCULAR | Status: DC | PRN

## 2024-06-10 MED ORDER — PROPOFOL 500 MG/50ML IV EMUL
INTRAVENOUS | Status: DC | PRN
  Administered 2024-06-10: 12:00:00 250 ug/kg/min via INTRAVENOUS

## 2024-06-10 MED ORDER — OXYCODONE HCL 5 MG PO TABS
ORAL_TABLET | ORAL | Status: AC
  Filled 2024-06-10: qty 1

## 2024-06-10 MED ORDER — BUPIVACAINE HCL (PF) 0.25 % IJ SOLN
INTRAMUSCULAR | Status: DC | PRN
  Administered 2024-06-10: 12:00:00 10 mL

## 2024-06-10 MED ORDER — LIDOCAINE HCL (CARDIAC) PF 100 MG/5ML IV SOSY
PREFILLED_SYRINGE | INTRAVENOUS | Status: DC | PRN
  Administered 2024-06-10: 12:00:00 70 mg via INTRAVENOUS

## 2024-06-10 MED ORDER — MIDAZOLAM HCL (PF) 2 MG/2ML IJ SOLN
INTRAMUSCULAR | Status: DC | PRN
  Administered 2024-06-10: 11:00:00 2 mg via INTRAVENOUS

## 2024-06-10 MED ORDER — CELECOXIB 200 MG PO CAPS
ORAL_CAPSULE | ORAL | Status: AC
  Filled 2024-06-10: qty 1

## 2024-06-10 MED ORDER — ONDANSETRON HCL 4 MG/2ML IJ SOLN
INTRAMUSCULAR | Status: DC | PRN
  Administered 2024-06-10: 12:00:00 4 mg via INTRAVENOUS

## 2024-06-10 MED ORDER — ROCURONIUM BROMIDE 10 MG/ML (PF) SYRINGE
PREFILLED_SYRINGE | INTRAVENOUS | Status: AC
  Filled 2024-06-10: qty 10

## 2024-06-10 MED ORDER — FENTANYL CITRATE (PF) 100 MCG/2ML IJ SOLN
INTRAMUSCULAR | Status: DC | PRN
  Administered 2024-06-10 (×2): 50 ug via INTRAVENOUS
  Administered 2024-06-10: 12:00:00 100 ug via INTRAVENOUS

## 2024-06-10 MED ORDER — PROPOFOL 10 MG/ML IV BOLUS
INTRAVENOUS | Status: DC | PRN
  Administered 2024-06-10: 12:00:00 200 mg via INTRAVENOUS

## 2024-06-10 MED ORDER — FENTANYL CITRATE (PF) 100 MCG/2ML IJ SOLN
INTRAMUSCULAR | Status: AC
  Filled 2024-06-10: qty 2

## 2024-06-10 MED ORDER — ONDANSETRON HCL 4 MG/2ML IJ SOLN
INTRAMUSCULAR | Status: AC
  Filled 2024-06-10: qty 2

## 2024-06-10 MED ORDER — BUPIVACAINE-EPINEPHRINE 0.25% -1:200000 IJ SOLN
INTRAMUSCULAR | Status: DC | PRN
  Administered 2024-06-10: 12:00:00 30 mL

## 2024-06-10 MED ORDER — CEFAZOLIN SODIUM-DEXTROSE 2-4 GM/100ML-% IV SOLN
INTRAVENOUS | Status: AC
  Filled 2024-06-10: qty 100

## 2024-06-10 MED ORDER — ACETAMINOPHEN 500 MG PO TABS
ORAL_TABLET | ORAL | Status: AC
  Filled 2024-06-10: qty 2

## 2024-06-10 MED ORDER — LACTATED RINGERS IV SOLN: INTRAVENOUS | Status: DC

## 2024-06-10 MED ORDER — OXYCODONE HCL 5 MG/5ML PO SOLN: 5.0000 mg | Freq: Once | ORAL | Status: AC | PRN

## 2024-06-10 MED ORDER — FENTANYL CITRATE (PF) 100 MCG/2ML IJ SOLN: 25.0000 ug | INTRAMUSCULAR | Status: DC | PRN

## 2024-06-10 MED ORDER — CHLORHEXIDINE GLUCONATE CLOTH 2 % EX PADS: 6.0000 | MEDICATED_PAD | Freq: Once | CUTANEOUS | Status: DC

## 2024-06-10 MED ORDER — PROPOFOL 10 MG/ML IV BOLUS
INTRAVENOUS | Status: AC
  Filled 2024-06-10: qty 20

## 2024-06-10 SURGICAL SUPPLY — 5 items
ELECT NDL BLADE 2-5/6 (NEEDLE) ×1 IMPLANT
NDL FILTER BLUNT 18X1 1/2 (NEEDLE) IMPLANT
NDL HYPO 25GX1X1/2 BEV (NEEDLE) IMPLANT
NDL HYPO 30GX1 BEV (NEEDLE) IMPLANT
NDL PRECISIONGLIDE 27X1.5 (NEEDLE) ×1 IMPLANT

## 2024-06-10 NOTE — Discharge Instructions (Addendum)
°  Post Anesthesia Home Care Instructions  Activity: Get plenty of rest for the remainder of the day. A responsible individual must stay with you for 24 hours following the procedure.  For the next 24 hours, DO NOT: -Drive a car -Advertising copywriter -Drink alcoholic beverages -Take any medication unless instructed by your physician -Make any legal decisions or sign important papers.  Meals: Start with liquid foods such as gelatin or soup. Progress to regular foods as tolerated. Avoid greasy, spicy, heavy foods. If nausea and/or vomiting occur, drink only clear liquids until the nausea and/or vomiting subsides. Call your physician if vomiting continues.  Special Instructions/Symptoms: Your throat may feel dry or sore from the anesthesia or the breathing tube placed in your throat during surgery. If this causes discomfort, gargle with warm salt water. The discomfort should disappear within 24 hours.  Next dose of pain med can be taken at 8pm if needed.  Leave dressing in place for 24 hours, 48 hours if not uncomfortable May shower in 24 hours Avoid heavy lifting and vigorous activity until cleared Call the office for any questions or concerns, [phone]

## 2024-06-10 NOTE — Anesthesia Preprocedure Evaluation (Addendum)
 Anesthesia Evaluation  Patient identified by MRN, date of birth, ID band Patient awake    Reviewed: Allergy & Precautions, NPO status , Patient's Chart, lab work & pertinent test results  Airway Mallampati: II  TM Distance: >3 FB Neck ROM: Full    Dental no notable dental hx.    Pulmonary sleep apnea    Pulmonary exam normal        Cardiovascular negative cardio ROS  Rhythm:Regular Rate:Normal     Neuro/Psych  Headaches  Anxiety        GI/Hepatic negative GI ROS, Neg liver ROS,,,  Endo/Other  negative endocrine ROS    Renal/GU negative Renal ROS  negative genitourinary   Musculoskeletal Torso lesions    Abdominal Normal abdominal exam  (+)   Peds  Hematology   Anesthesia Other Findings   Reproductive/Obstetrics                              Anesthesia Physical Anesthesia Plan  ASA: 3  Anesthesia Plan: General   Post-op Pain Management: Celebrex  PO (pre-op)* and Tylenol  PO (pre-op)*   Induction: Intravenous  PONV Risk Score and Plan: 2 and Ondansetron , Dexamethasone , Midazolam , Treatment may vary due to age or medical condition, Propofol  infusion and TIVA  Airway Management Planned: Mask and Oral ETT  Additional Equipment: None  Intra-op Plan:   Post-operative Plan: Extubation in OR  Informed Consent: I have reviewed the patients History and Physical, chart, labs and discussed the procedure including the risks, benefits and alternatives for the proposed anesthesia with the patient or authorized representative who has indicated his/her understanding and acceptance.     Dental advisory given  Plan Discussed with: CRNA  Anesthesia Plan Comments:          Anesthesia Quick Evaluation

## 2024-06-10 NOTE — Interval H&P Note (Signed)
 History and Physical Interval Note: No change in exam or indication for surgery All questions answered Back and shoulder marked with his concurrence Will proceed with lipoma removal at his request  06/10/2024 10:51 AM  Adam Trevino  has presented today for surgery, with the diagnosis of d48.9.  The various methods of treatment have been discussed with the patient and family. After consideration of risks, benefits and other options for treatment, the patient has consented to  Procedures with comments: EXCISION, MASS, TORSO (N/A) - Removal of subcutaneous masses x 2, upper back left shoulder as a surgical intervention.  The patient's history has been reviewed, patient examined, no change in status, stable for surgery.  I have reviewed the patient's chart and labs.  Questions were answered to the patient's satisfaction.     Leonce KATHEE Birmingham

## 2024-06-10 NOTE — Op Note (Signed)
 DATE OF OPERATION: 06/10/2024  LOCATION: [facility] operating Room  PREOPERATIVE DIAGNOSIS: Subcutaneous mass left upper back and left shoulder  POSTOPERATIVE DIAGNOSIS: Same  PROCEDURE: Excision of subcutaneous masses x 2  SURGEON: Marinell Birmingham, MD  ASSISTANT: Not applicable  EBL: 10 cc  CONDITION: Stable  COMPLICATIONS: None  INDICATION: The patient, Adam Trevino, is a 38 y.o. male born on [birth year], is here for treatment of subcutaneous masses which were removed for pathologic diagnosis.   PROCEDURE DETAILS:  The patient was seen prior to surgery and marked.  The IV antibiotics were given. The patient was taken to the operating room and given a general anesthetic.  He was then placed in the prone position A standard time out was performed and all information was confirmed by those in the room. SCDs were placed.   The left upper back and shoulder were prepped and draped in usual sterile manner.  An incision was made over the left upper back mass sharply and a combination of sharp dissection and electrocautery were used to isolate and remove the mass.  The mass measured 6.5 cm x 6 cm.  The subcutaneous tissues and subfascial space were infiltrated with quarter percent Marcaine  with epinephrine .  The deep fascia was approximated with interrupted 3-0 Monocryl sutures the deep dermis was closed with interrupted 3-0 Monocryl sutures the dermis was closed with a running 3-0 Monocryl stitch and the skin was closed with a running 4-0 Monocryl subcuticular stitch.  The total length of the incision was 5-1/2 cm.  Attention was turned to the shoulder.  An incision was made over the shoulder mass sharply and a combination of sharp dissection and electrocautery dissection were used to isolate and remove the mass.  Hemostasis was achieved with electrocautery and the subcutaneous tissues were infiltrated with quarter percent Marcaine  with and without epinephrine .  The deep fascia was closed with  interrupted 3-0 Monocryl sutures.  The deep dermis was closed with interrupted 3-0 Monocryl sutures.  The dermis was closed with a running 3-0 Monocryl suture.  The skin was closed with a running 4-0 Monocryl subcuticular stitch.  The mass measured 5 x 6 cm and the incision was 3 cm in length.  All incisions were sealed with Dermabond and sterile dressings were applied.  The patient was awakened from anesthesia without incident transferred to the recovery room in good condition.  All instrument needle and sponge counts reported as correct and no complications were appreciated during the procedure. The patient was allowed to wake up and taken to recovery room in stable condition at the end of the case. The family was notified at the end of the case.

## 2024-06-10 NOTE — Anesthesia Postprocedure Evaluation (Signed)
 Anesthesia Post Note  Patient: Adam Trevino  Procedure(s) Performed: EXCISION, MASS, TORSO (Left: Back)     Patient location during evaluation: PACU Anesthesia Type: General Level of consciousness: awake and alert Pain management: pain level controlled Vital Signs Assessment: post-procedure vital signs reviewed and stable Respiratory status: spontaneous breathing, nonlabored ventilation, respiratory function stable and patient connected to nasal cannula oxygen Cardiovascular status: blood pressure returned to baseline and stable Postop Assessment: no apparent nausea or vomiting Anesthetic complications: no   No notable events documented.  Last Vitals:  Vitals:   06/10/24 1415 06/10/24 1424  BP:  (!) 121/91  Pulse: 65 63  Resp: 15   Temp:  (!) 36.3 C  SpO2: 99% 99%    Last Pain:  Vitals:   06/10/24 1424  TempSrc: Temporal  PainSc: 4                  [name] P [name]

## 2024-06-10 NOTE — Anesthesia Procedure Notes (Signed)
 Procedure Name: Intubation Date/Time: 06/10/2024 11:37 AM  Performed by: Brandy Almarie BROCKS, CRNAPre-anesthesia Checklist: Patient identified, Emergency Drugs available, Suction available and Patient being monitored Patient Re-evaluated:Patient Re-evaluated prior to induction Oxygen Delivery Method: Circle system utilized Preoxygenation: Pre-oxygenation with 100% oxygen Induction Type: IV induction Ventilation: Mask ventilation without difficulty Laryngoscope Size: Mac and 4 Grade View: Grade I Tube type: Oral Tube size: 7.5 mm Number of attempts: 1 Airway Equipment and Method: Stylet Placement Confirmation: ETT inserted through vocal cords under direct vision, positive ETCO2 and breath sounds checked- equal and bilateral Secured at: 23 cm Tube secured with: Tape Dental Injury: Teeth and Oropharynx as per pre-operative assessment

## 2024-06-10 NOTE — Transfer of Care (Signed)
 Immediate Anesthesia Transfer of Care Note  Patient: Adam Trevino  Procedure(s) Performed: EXCISION, MASS, TORSO (Left: Back)  Patient Location: PACU  Anesthesia Type:General  Level of Consciousness: drowsy  Airway & Oxygen Therapy: Patient Spontanous Breathing  Post-op Assessment: Report given to RN, Post -op Vital signs reviewed and stable, and Patient moving all extremities X 4  Post vital signs: Reviewed and stable  Last Vitals:  Vitals Value Taken Time  BP 136/68   Temp    Pulse 71 06/10/24 13:04  Resp 15 06/10/24 13:04  SpO2 97 % 06/10/24 13:04  Vitals shown include unfiled device data.  Last Pain:  Vitals:   06/10/24 0945  TempSrc: Temporal  PainSc: 0-No pain      Patients Stated Pain Goal: 1 (06/10/24 0945)  Complications: No notable events documented.

## 2024-06-11 ENCOUNTER — Encounter (HOSPITAL_BASED_OUTPATIENT_CLINIC_OR_DEPARTMENT_OTHER): Payer: Self-pay | Admitting: Plastic Surgery

## 2024-06-12 ENCOUNTER — Telehealth: Payer: Self-pay | Admitting: Plastic Surgery

## 2024-06-12 NOTE — Telephone Encounter (Signed)
 postop:Removal of subcutaneous masses/ins/sx/12-16/[facility]/11:30am Pt jut called and said he can not make his apt Monday due to work and needs to r/s, there are no other openings with Dr Waddell or a PA and he stated he can't miss work, please advise the patient

## 2024-06-13 LAB — SURGICAL PATHOLOGY

## 2024-06-16 ENCOUNTER — Ambulatory Visit: Admitting: Plastic Surgery

## 2024-06-16 VITALS — BP 146/81 | HR 92

## 2024-06-16 DIAGNOSIS — D171 Benign lipomatous neoplasm of skin and subcutaneous tissue of trunk: Secondary | ICD-10-CM

## 2024-06-16 DIAGNOSIS — Z9889 Other specified postprocedural states: Secondary | ICD-10-CM

## 2024-06-16 DIAGNOSIS — D4819 Other specified neoplasm of uncertain behavior of connective and other soft tissue: Secondary | ICD-10-CM

## 2024-06-16 NOTE — Progress Notes (Signed)
 Adam Trevino returns today approximately 1 week postop from removal of a left upper back and left shoulder subcutaneous mass.  He is doing well with no complaints he had some discomfort the first couple of nights when he laid on the surgical incision but this is improved.  On examination the incisions are clean dry and intact no erythema and no palpable fluid collections.  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  This is considered to be nonmalignant but has a high chance of recurrence.  For this reason I have asked him to return and see me in 2 to 3 months just for simple follow-up.  Otherwise he may slowly return to activity as tolerated.

## 2024-07-02 ENCOUNTER — Encounter: Admitting: Physician Assistant

## 2024-09-17 ENCOUNTER — Ambulatory Visit: Admitting: Plastic Surgery
